# Patient Record
Sex: Female | Born: 1945 | Race: White | Hispanic: No | Marital: Married | Smoking: Never smoker
Health system: Southern US, Community
[De-identification: ages and names within clinical notes are randomized; demographics above are authoritative.]

## PROBLEM LIST (undated history)

## (undated) DIAGNOSIS — F419 Anxiety disorder, unspecified: Secondary | ICD-10-CM

## (undated) DIAGNOSIS — I071 Rheumatic tricuspid insufficiency: Secondary | ICD-10-CM

## (undated) DIAGNOSIS — I350 Nonrheumatic aortic (valve) stenosis: Secondary | ICD-10-CM

## (undated) DIAGNOSIS — I509 Heart failure, unspecified: Secondary | ICD-10-CM

## (undated) DIAGNOSIS — I340 Nonrheumatic mitral (valve) insufficiency: Secondary | ICD-10-CM

## (undated) DIAGNOSIS — E785 Hyperlipidemia, unspecified: Secondary | ICD-10-CM

## (undated) DIAGNOSIS — I10 Essential (primary) hypertension: Secondary | ICD-10-CM

## (undated) DIAGNOSIS — I4891 Unspecified atrial fibrillation: Secondary | ICD-10-CM

## (undated) DIAGNOSIS — E039 Hypothyroidism, unspecified: Secondary | ICD-10-CM

## (undated) DIAGNOSIS — I499 Cardiac arrhythmia, unspecified: Secondary | ICD-10-CM

## (undated) DIAGNOSIS — M1990 Unspecified osteoarthritis, unspecified site: Secondary | ICD-10-CM

## (undated) DIAGNOSIS — E559 Vitamin D deficiency, unspecified: Secondary | ICD-10-CM

## (undated) HISTORY — DX: Essential (primary) hypertension: I10

## (undated) HISTORY — PX: CARDIAC ELECTROPHYSIOLOGY STUDY AND ABLATION: SHX1294

## (undated) HISTORY — PX: BREAST BIOPSY: SHX20

---

## 2004-12-07 ENCOUNTER — Ambulatory Visit: Payer: Self-pay | Admitting: Internal Medicine

## 2005-11-07 HISTORY — PX: MECHANICAL AORTIC VALVE REPLACEMENT: SHX2013

## 2006-04-23 ENCOUNTER — Other Ambulatory Visit: Payer: Self-pay

## 2006-04-23 ENCOUNTER — Emergency Department: Payer: Self-pay | Admitting: Emergency Medicine

## 2006-07-07 ENCOUNTER — Ambulatory Visit: Payer: Self-pay | Admitting: Cardiovascular Disease

## 2006-07-21 DIAGNOSIS — Z952 Presence of prosthetic heart valve: Secondary | ICD-10-CM | POA: Insufficient documentation

## 2006-07-21 DIAGNOSIS — Z5181 Encounter for therapeutic drug level monitoring: Secondary | ICD-10-CM | POA: Insufficient documentation

## 2006-08-23 ENCOUNTER — Encounter: Payer: Self-pay | Admitting: Cardiovascular Disease

## 2006-09-07 ENCOUNTER — Encounter: Payer: Self-pay | Admitting: Cardiovascular Disease

## 2007-04-04 ENCOUNTER — Ambulatory Visit: Payer: Self-pay | Admitting: Internal Medicine

## 2008-02-18 ENCOUNTER — Inpatient Hospital Stay: Payer: Self-pay | Admitting: Cardiovascular Disease

## 2008-02-20 ENCOUNTER — Other Ambulatory Visit: Payer: Self-pay

## 2008-02-21 ENCOUNTER — Other Ambulatory Visit: Payer: Self-pay

## 2008-04-04 ENCOUNTER — Other Ambulatory Visit: Payer: Self-pay

## 2008-04-04 ENCOUNTER — Emergency Department: Payer: Self-pay | Admitting: Emergency Medicine

## 2008-08-22 ENCOUNTER — Ambulatory Visit: Payer: Self-pay | Admitting: Internal Medicine

## 2009-04-10 ENCOUNTER — Ambulatory Visit: Payer: Self-pay | Admitting: Cardiovascular Disease

## 2009-07-11 ENCOUNTER — Emergency Department: Payer: Self-pay | Admitting: Emergency Medicine

## 2009-10-07 ENCOUNTER — Ambulatory Visit: Payer: Self-pay | Admitting: Internal Medicine

## 2009-10-26 ENCOUNTER — Ambulatory Visit: Payer: Self-pay | Admitting: Gastroenterology

## 2010-03-30 IMAGING — CR DG CHEST 1V PORT
1 series · 1 of 1 positions shown · non-contrast
Comparison: none

REASON FOR EXAM: sob
COMMENTS:

[view not recorded]
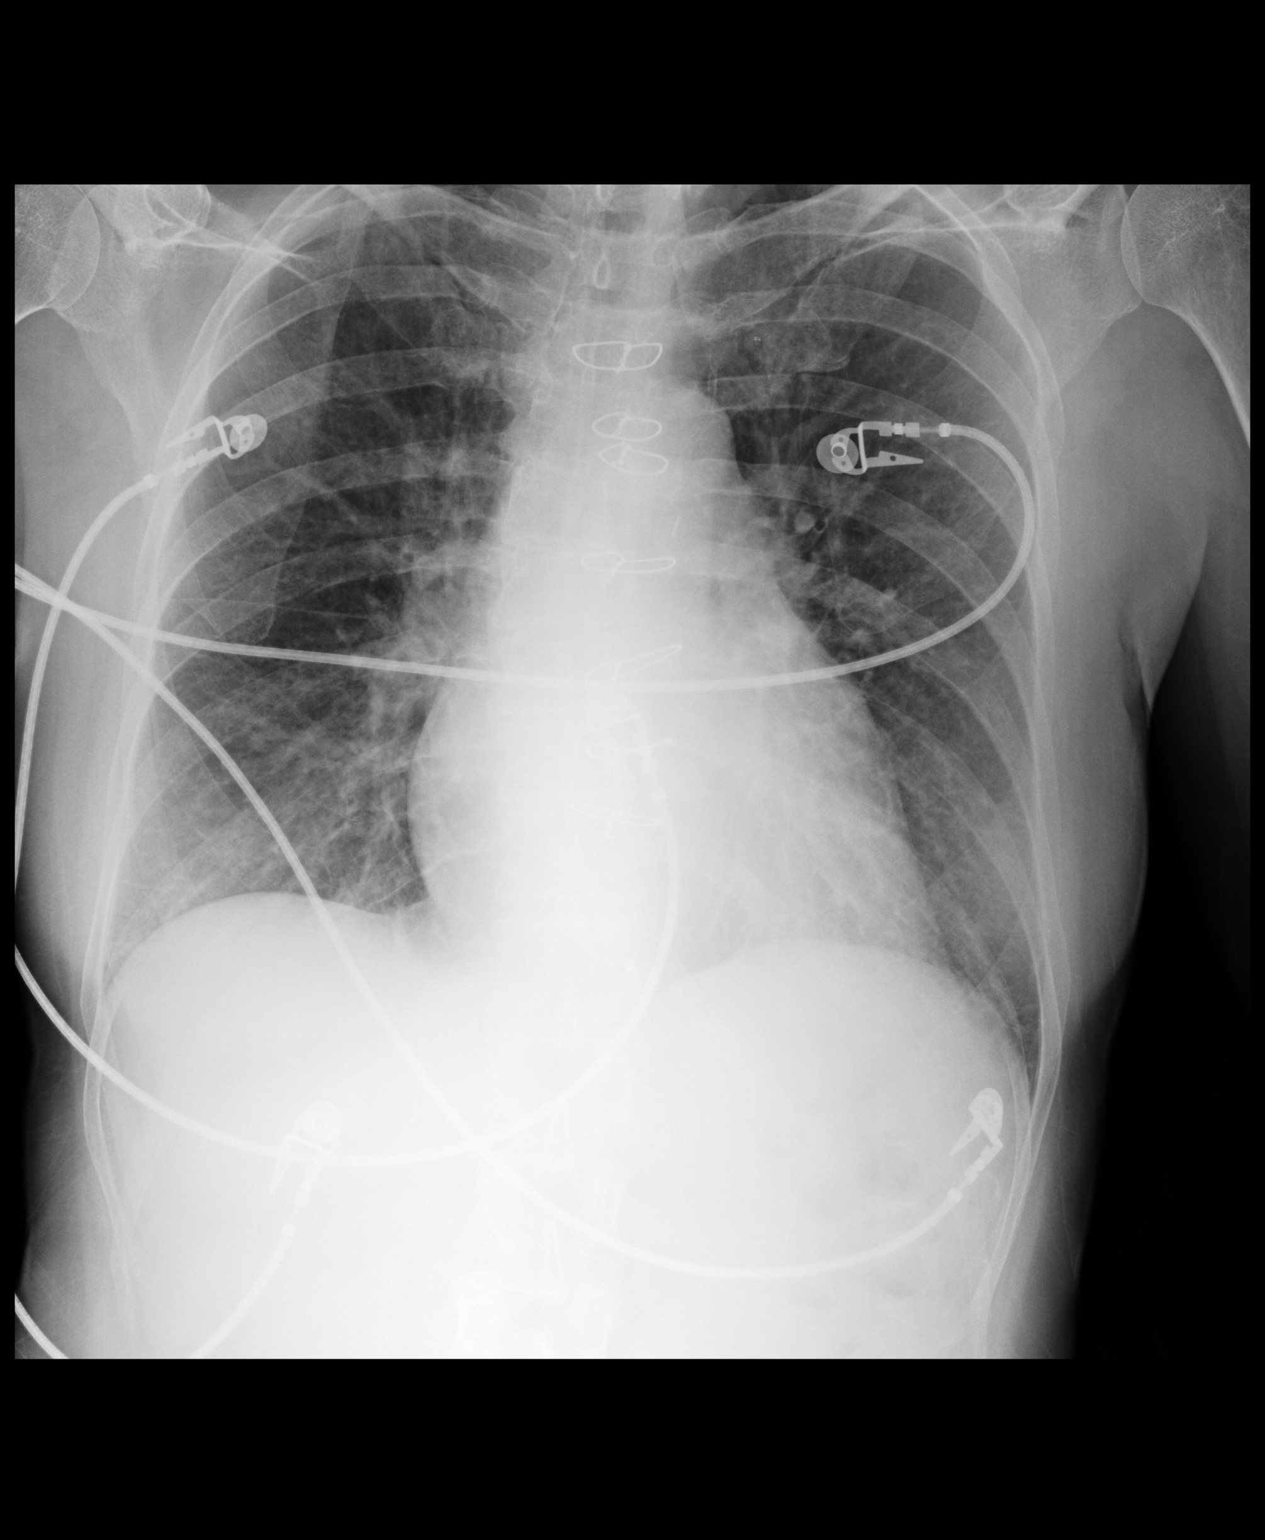

[1 of 1 positions shown; findings below may reference images not displayed]

PROCEDURE:     DXR - DXR PORTABLE CHEST SINGLE VIEW  - February 18, 2008 [DATE]

RESULT:     There is slight prominence of the pulmonary vascularity
compatible with pulmonary vascular congestion. No definite interstitial or
pulmonary edema is seen. No pleural effusion is noted. The heart is upper
limits to normal in size. Postoperative changes of prior CABG are observed.
IMPRESSION: 1. There is mild prominence of the pulmonary vascularity compatible with
mild pulmonary vascular congestion.
2. No pulmonary edema or pneumonia is seen.
3. The patient is status post CABG.

## 2010-10-13 ENCOUNTER — Ambulatory Visit: Payer: Self-pay | Admitting: Cardiovascular Disease

## 2011-02-02 ENCOUNTER — Ambulatory Visit: Payer: Self-pay | Admitting: Internal Medicine

## 2011-10-13 ENCOUNTER — Ambulatory Visit: Payer: Self-pay | Admitting: Cardiovascular Disease

## 2012-04-26 ENCOUNTER — Ambulatory Visit: Payer: Self-pay | Admitting: Internal Medicine

## 2012-04-29 DIAGNOSIS — I350 Nonrheumatic aortic (valve) stenosis: Secondary | ICD-10-CM | POA: Insufficient documentation

## 2012-05-08 DIAGNOSIS — IMO0001 Reserved for inherently not codable concepts without codable children: Secondary | ICD-10-CM | POA: Insufficient documentation

## 2012-05-08 DIAGNOSIS — H9190 Unspecified hearing loss, unspecified ear: Secondary | ICD-10-CM | POA: Insufficient documentation

## 2012-05-09 DIAGNOSIS — Z8679 Personal history of other diseases of the circulatory system: Secondary | ICD-10-CM | POA: Insufficient documentation

## 2012-08-29 ENCOUNTER — Ambulatory Visit: Payer: Self-pay | Admitting: Specialist

## 2012-11-22 ENCOUNTER — Ambulatory Visit: Payer: Self-pay | Admitting: Internal Medicine

## 2012-12-24 ENCOUNTER — Inpatient Hospital Stay: Payer: Self-pay | Admitting: Internal Medicine

## 2012-12-24 LAB — COMPREHENSIVE METABOLIC PANEL
Anion Gap: 9 (ref 7–16)
Co2: 22 mmol/L (ref 21–32)
EGFR (African American): >60
Potassium: 4.4 mmol/L (ref 3.5–5.1)
SGOT(AST): 50 U/L — ABNORMAL HIGH (ref 15–37)
SGPT (ALT): 41 U/L (ref 12–78)

## 2012-12-24 LAB — CBC
MCH: 28.8 pg (ref 26.0–34.0)
Platelet: 164 10*3/uL (ref 150–440)

## 2012-12-24 LAB — APTT: Activated PTT: 35.4 s

## 2012-12-24 LAB — T4, FREE: Free Thyroxine: 4.42 ng/dL — ABNORMAL HIGH

## 2012-12-24 LAB — TSH: Thyroid Stimulating Horm: <0.01 u[IU]/mL — ABNORMAL LOW

## 2012-12-24 LAB — PROTIME-INR
INR: 2.2
Prothrombin Time: 25 s — ABNORMAL HIGH

## 2012-12-24 LAB — TROPONIN I: Troponin-I: <0.02 ng/mL

## 2012-12-25 LAB — CBC WITH DIFFERENTIAL/PLATELET
Basophil #: 0 10*3/uL (ref 0.0–0.1)
Basophil %: 0.4 %
Eosinophil #: 0.1 10*3/uL (ref 0.0–0.7)
HGB: 10.5 g/dL — ABNORMAL LOW (ref 12.0–16.0)
Lymphocyte #: 1.6 10*3/uL (ref 1.0–3.6)
MCH: 28.7 pg (ref 26.0–34.0)
MCV: 85 fL (ref 80–100)
Monocyte #: 0.7 x10 3/mm (ref 0.2–0.9)
Platelet: 128 10*3/uL — ABNORMAL LOW (ref 150–440)
WBC: 4.1 10*3/uL (ref 3.6–11.0)

## 2012-12-25 LAB — BASIC METABOLIC PANEL
Anion Gap: 7 (ref 7–16)
BUN: 13 mg/dL (ref 7–18)
Calcium, Total: 8.5 mg/dL (ref 8.5–10.1)
Chloride: 114 mmol/L — ABNORMAL HIGH (ref 98–107)
EGFR (Non-African Amer.): >60
Glucose: 97 mg/dL (ref 65–99)
Osmolality: 287 (ref 275–301)
Potassium: 4.4 mmol/L (ref 3.5–5.1)
Sodium: 144 mmol/L (ref 136–145)

## 2012-12-25 LAB — MAGNESIUM: Magnesium: 1.7 mg/dL — ABNORMAL LOW

## 2012-12-26 LAB — BASIC METABOLIC PANEL
Anion Gap: 6 — ABNORMAL LOW (ref 7–16)
BUN: 14 mg/dL (ref 7–18)
Calcium, Total: 8.6 mg/dL (ref 8.5–10.1)
Co2: 24 mmol/L (ref 21–32)
Creatinine: 0.6 mg/dL (ref 0.60–1.30)
EGFR (African American): >60
EGFR (Non-African Amer.): >60
Osmolality: 286 (ref 275–301)
Potassium: 4.3 mmol/L (ref 3.5–5.1)

## 2012-12-26 LAB — T4, FREE: Free Thyroxine: 3.38 ng/dL — ABNORMAL HIGH (ref 0.76–1.46)

## 2012-12-26 LAB — PROTIME-INR: INR: 2.9

## 2012-12-26 LAB — MAGNESIUM: Magnesium: 1.9 mg/dL

## 2012-12-27 LAB — BASIC METABOLIC PANEL
Calcium, Total: 8.2 mg/dL — ABNORMAL LOW (ref 8.5–10.1)
Chloride: 111 mmol/L — ABNORMAL HIGH (ref 98–107)
Creatinine: 0.64 mg/dL (ref 0.60–1.30)
EGFR (African American): >60
EGFR (Non-African Amer.): >60
Osmolality: 286 (ref 275–301)

## 2012-12-27 LAB — HEPATIC FUNCTION PANEL A (ARMC)
Albumin: 2.8 g/dL — ABNORMAL LOW (ref 3.4–5.0)
Alkaline Phosphatase: 59 U/L (ref 50–136)
Bilirubin, Direct: 0.3 mg/dL — ABNORMAL HIGH (ref 0.00–0.20)
Total Protein: 5.6 g/dL — ABNORMAL LOW (ref 6.4–8.2)

## 2012-12-28 LAB — BASIC METABOLIC PANEL
Anion Gap: 9 (ref 7–16)
BUN: 12 mg/dL (ref 7–18)
Calcium, Total: 7.9 mg/dL — ABNORMAL LOW (ref 8.5–10.1)
Chloride: 107 mmol/L (ref 98–107)
Creatinine: 0.65 mg/dL (ref 0.60–1.30)
EGFR (Non-African Amer.): >60
Glucose: 101 mg/dL — ABNORMAL HIGH (ref 65–99)
Osmolality: 279 (ref 275–301)

## 2012-12-28 LAB — MAGNESIUM: Magnesium: 1.6 mg/dL — ABNORMAL LOW

## 2012-12-28 LAB — T4, FREE: Free Thyroxine: 3.36 ng/dL — ABNORMAL HIGH (ref 0.76–1.46)

## 2012-12-28 LAB — PROTIME-INR
INR: 3.8
Prothrombin Time: 37.4 secs — ABNORMAL HIGH (ref 11.5–14.7)

## 2013-02-07 DIAGNOSIS — E0590 Thyrotoxicosis, unspecified without thyrotoxic crisis or storm: Secondary | ICD-10-CM | POA: Insufficient documentation

## 2013-08-13 ENCOUNTER — Ambulatory Visit: Payer: Self-pay | Admitting: Internal Medicine

## 2013-11-07 HISTORY — PX: OTHER SURGICAL HISTORY: SHX169

## 2014-01-18 ENCOUNTER — Inpatient Hospital Stay: Payer: Self-pay | Admitting: Internal Medicine

## 2014-01-18 LAB — POTASSIUM
Potassium: 3.6 mmol/L (ref 3.5–5.1)
Potassium: 4 mmol/L (ref 3.5–5.1)

## 2014-01-18 LAB — COMPREHENSIVE METABOLIC PANEL
ALBUMIN: 3.5 g/dL (ref 3.4–5.0)
Alkaline Phosphatase: 106 U/L
Anion Gap: 9 (ref 7–16)
BILIRUBIN TOTAL: 1.6 mg/dL — AB (ref 0.2–1.0)
BUN: 6 mg/dL — ABNORMAL LOW (ref 7–18)
CO2: 22 mmol/L (ref 21–32)
Calcium, Total: 8.7 mg/dL (ref 8.5–10.1)
Chloride: 107 mmol/L (ref 98–107)
Creatinine: 1 mg/dL (ref 0.60–1.30)
GFR CALC NON AF AMER: 59 — AB
GLUCOSE: 167 mg/dL — AB (ref 65–99)
OSMOLALITY: 277 (ref 275–301)
POTASSIUM: 3.4 mmol/L — AB (ref 3.5–5.1)
SGOT(AST): 90 U/L — ABNORMAL HIGH (ref 15–37)
SGPT (ALT): 44 U/L (ref 12–78)
Sodium: 138 mmol/L (ref 136–145)
TOTAL PROTEIN: 6.5 g/dL (ref 6.4–8.2)

## 2014-01-18 LAB — URINALYSIS, COMPLETE
BACTERIA: NONE SEEN
Bilirubin,UR: NEGATIVE
Blood: NEGATIVE
Glucose,UR: 50 mg/dL (ref 0–75)
KETONE: NEGATIVE
Leukocyte Esterase: NEGATIVE
Nitrite: NEGATIVE
PH: 6 (ref 4.5–8.0)
Protein: 100
SPECIFIC GRAVITY: 1.013 (ref 1.003–1.030)
Squamous Epithelial: NONE SEEN
WBC UR: 2 /HPF (ref 0–5)

## 2014-01-18 LAB — CK-MB: CK-MB: 3.1 ng/mL (ref 0.5–3.6)

## 2014-01-18 LAB — PROTIME-INR
INR: 3.2
Prothrombin Time: 32 secs — ABNORMAL HIGH (ref 11.5–14.7)

## 2014-01-18 LAB — CBC
HCT: 33.3 % — AB (ref 35.0–47.0)
HGB: 11 g/dL — ABNORMAL LOW (ref 12.0–16.0)
MCH: 26.9 pg (ref 26.0–34.0)
MCHC: 33.1 g/dL (ref 32.0–36.0)
MCV: 81 fL (ref 80–100)
Platelet: 178 10*3/uL (ref 150–440)
RBC: 4.1 10*6/uL (ref 3.80–5.20)
RDW: 15.5 % — ABNORMAL HIGH (ref 11.5–14.5)
WBC: 7.1 10*3/uL (ref 3.6–11.0)

## 2014-01-18 LAB — TSH: Thyroid Stimulating Horm: 1.59 u[IU]/mL

## 2014-01-18 LAB — CK TOTAL AND CKMB (NOT AT ARMC)
CK, TOTAL: 117 U/L
CK, Total: 112 U/L
CK-MB: 2 ng/mL (ref 0.5–3.6)
CK-MB: 2.7 ng/mL (ref 0.5–3.6)

## 2014-01-18 LAB — APTT: ACTIVATED PTT: 36.2 s — AB (ref 23.6–35.9)

## 2014-01-18 LAB — TROPONIN I
TROPONIN-I: 0.21 ng/mL — AB
TROPONIN-I: 0.34 ng/mL — AB
TROPONIN-I: 0.29 ng/mL — AB

## 2014-01-18 LAB — MAGNESIUM
Magnesium: 1.7 mg/dL — ABNORMAL LOW
Magnesium: 3.7 mg/dL — ABNORMAL HIGH
Magnesium: 3.1 mg/dL — ABNORMAL HIGH

## 2014-01-18 LAB — PHOSPHORUS: PHOSPHORUS: 2.2 mg/dL — AB (ref 2.5–4.9)

## 2014-01-18 LAB — PRO B NATRIURETIC PEPTIDE: B-Type Natriuretic Peptide: 1358 pg/mL — ABNORMAL HIGH (ref 0–125)

## 2014-01-30 DIAGNOSIS — Z950 Presence of cardiac pacemaker: Secondary | ICD-10-CM | POA: Insufficient documentation

## 2014-09-30 ENCOUNTER — Ambulatory Visit: Payer: Self-pay | Admitting: Internal Medicine

## 2014-11-05 ENCOUNTER — Ambulatory Visit: Payer: Self-pay | Admitting: Internal Medicine

## 2015-01-03 IMAGING — NM NM THYROID IMAGING W/ UPTAKE SINGLE (24 HR)
1 series · 1 of 1 positions shown · non-contrast
Comparison: none

REASON FOR EXAM: hyperthyroidism
COMMENTS:

[Series 1000: (id) thyroid scan · 2.40mm/px · 1 of 1 slices shown]
[im 1/1  full-range]
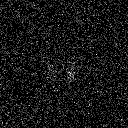

[1 of 1 positions shown; findings below may reference images not displayed]

PROCEDURE:     KNM - KNM THYROID Y-AYY 24HR [DATE]  [DATE]

RESULT:     Following administration of  145.6 microcuries of Y-AYY ,thyroid
scan and radioactive iodine uptake was determine. Radioactive iodine uptake
is extremely low at 6 and 24 hours. Radioactive iodine uptake is 0.8%. The
thyroid is barely visible on the scan. Hypothyroidism could present in this
fashion. Thyroiditis could inpresent this fashion. Medications can result in
low radioactive iodine uptake. Clinical correlation suggested suggested.
IMPRESSION: Extremely low radioactive radioactive iodine uptake as
described above.
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
## 2015-02-27 NOTE — Consult Note (Signed)
PATIENT NAME:  Kristin Ferrell, Kristin Ferrell MR#:  [id number] DATE OF BIRTH:  09/22/1946  DATE OF CONSULTATION:  12/25/2012  REQUESTING PROVIDER:  Enid Baasadhika Kalisetti, MD  DICTATING CONSULTANT:  Wendall MolaMelissa Solum, MD   CHIEF COMPLAINT:  Hyperthyroidism.   HISTORY OF PRESENT ILLNESS: This is a 69 year old female seen in consultation at the request of Dr. Nemiah CommanderKalisetti  today for hyperthyroidism. The patient was admitted yesterday with complaints of palpitations and presyncope.  At the office of Dr. Welton FlakesKhan, she had rapid atrial fibrillation with rapid ventricular response with a rate of 170. She has a history of hyperthyroidism and atrial fibrillation. Onset was about 4 years ago. She recalls that she was treated with medication for the hyperthyroidism for a period of time and was able to get off the medication when her thyroid function tests apparently normalized. In January of this year, she was again diagnosed with hyperthyroidism. She had been following up with Dr. Dario GuardianJadali. She underwent a thyroid uptake/scan on November 23, 2012 at [facility]. Both her 6 and 24-hour uptake were very low at 0.8%. At this time, her TSH is low at less than 0.010 and free T4 is elevated at 4.42. Heart rate is in the one 110s. She was taking amiodarone for atrial fibrillation up until about 6 months ago. She was exposed to contrast dye in November of this year when she underwent a CT angiogram. She denies any use of lithium or glucocorticoids. Since hospitalization yesterday, she has been receiving methimazole 10 mg t.i.d.    PAST MEDICAL HISTORY: 1.  Atrial fibrillation.  2.  Hyperthyroidism, 2011 and again January 2014.  3.  Hypertension.  4.  Hyperlipidemia.  5.  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  6.  Osteoporosis.   OUTPATIENT MEDICATIONS: 1.  Crestor 5 mg daily.  2.  Calcium 600 mg daily.  3.  Omega-3 fatty acids 1000 mg daily.  4.  Allegra 60 mg daily, as needed.  5.  Metoprolol ER 25 mg as needed for palpitations.  6.   Warfarin, as directed.  7.  Ramipril 2.5 mg daily.  8.  Tikosyn 250 mg q. 12 hours.   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 She does not smoke cigarettes.   FAMILY HISTORY:  No known thyroid disease.   ALLERGIES:  No known drug allergies.   PAST SURGICAL HISTORY: 1.  Mechanical aortic valve replacement, 2007.  2.  Left hip repair status post fracture. 3.  Left knee surgery.  REVIEW OF SYSTEMS: No weight loss. No fever.  HEENT:  No blurred vision. No sore throat.  NECK:  She has had choking at times on dry foods. She denies neck pain.  CARDIAC:  She has had palpitations as per HPI. She denies chest pain at this time.  PULMONARY:  She has dyspnea on exertion. She denies cough.  ABDOMEN:  Good appetite. No recent weight change.  EXTREMITIES:  Denies leg edema.  SKIN:  No rash or recent skin changes, denies dry skin.  ENDOCRINE:  Denies heat or cold intolerance.  GENITOURINARY:  Denies polyuria or dysuria. HEMATOLOGIC:  Denies easy bruisability or recent bleeding.   PHYSICAL EXAMINATION: VITAL SIGNS:  Height 65 inches, weight 140 pounds, BMI 23, temperature 97.5, pulse 111 - 140, respirations 18, BP 82/69, pulse oximetry 98% on 2 liters O2.  GENERAL:  Well-developed white female in no acute distress.  HEENT:  EOMI.  No proptosis. No lid lag or stare. Oropharynx clear.  Mucous membranes moist.  NECK:  Supple. No neck tenderness to palpation. No thyromegaly. No palpable thyroid nodules.  CARDIAC:  Irregular, irregular and tachycardic. No appreciable carotid bruit.  PULMONARY:  Clear bilaterally. No wheeze.  ABDOMEN:  Diffusely soft, nontender. Positive bowel sounds.  EXTREMITIES:  No edema is present.  SKIN:  No rash or dermatopathy is present.  No acanthosis nigricans.  NEUROLOGIC:  No tremor of outstretched hands.   ASSESSMENT: A 69 year old female with hyperthyroidism rapid atrial fibrillation. Onset of hyperthyroidism was at least in  January of this year. Thyroid uptake/scan on January 17 showed low uptake diffusely, which is commonly seen in hypothyroidism or acute thyroiditis. Her clinical picture does not fit with either of these causes, rather it fits most with recurrence of autoimmune thyroid disease or Graves' disease.   RECOMMENDATIONS: 1.  I recommend continuing methimazole. Will adjust dose to 30 mg b.i.d.  2. Will obtain thyroid autoantibodies to include thyrotropin receptor antibody and thyroid stimulating immunoglobulin. These labs tend to be elevated in autoimmune thyroid disease.  3.  Continue beta blocker, as tolerated to control tachycardia if her blood pressure will tolerate.  4. Will repeat free T4 in 2 to 3 days. TSH likely will remain suppressed for many weeks, however, free T4 should improve with methimazole if this is autoimmune thyroid disease.   Thank you for the kind request for consultation. I will follow along with you.      ____________________________ A. Wendall Mola, MD ams:ce D: 12/25/2012 16:51:34 ET T: 12/25/2012 17:11:44 ET JOB#: [id number]  cc: A. Wendall Mola, MD, <Dictator> Macy Mis MD ELECTRONICALLY SIGNED 12/30/2012 19:45

## 2015-02-27 NOTE — Discharge Summary (Signed)
  DATE OF BIRTH:  1946-05-22  DATE OF ADMISSION:  12/24/2012  DATE OF DISCHARGE:  12/28/2012  DISCHARGE DIAGNOSES: 1.  Atrial fibrillation.  2.   Silent thyroiditis.   SECONDARY DIAGNOSES: 1.  Valvular heart disease, status post aortic valve replacement.  2.  Hyperlipidemia.  3.  Hypertension.   DISCHARGE MEDICATIONS:   1.  Fish oil 1000 mg daily. 2.  Crestor 5 mg at bedtime. 3.  Calcium and vitamin D. 4.  Ramipril 2.5 mg daily. 5.  Coumadin 6 mg Monday, Wednesday, Friday; 5 mg Tuesday, Thursday, Saturday, Sunday. 6.  Tikosyn 250 mcg b.i.d.  7.  Metamucil.  8.  Prednisone 50 mg once a day.  9.  Propranolol 20 mg 4 times a day.  10.  Magnesium oxide 400 mg t.i.d.  Medications discontinued:  Metoprolol.   HOSPITAL COURSE: This lady was admitted from her cardiologist's office with a rapid ventricular response and longstanding atrial fibrillation, and biochemical evidence of hyperthyroidism. An Endocrinology consultation was placed with Dr. Tedd SiasSolum, who investigated the patient for possible recurrence of her Graves' disease. However, her biochemical workup suggested silent thyroiditis, and patient was started on oral prednisone. The patient's heart rate was initially elevated. Initially, control of that was complicated by her hypotension. However, we were able to gradually titrate her propranolol to an effective dose, and once patient started her prednisone, she responded to that quite well symptomatically, with improvement in her heart rate. Dr. Tedd SiasSolum recommended continuing the prednisone until at least her followup appointment in early March. The patient's stay was otherwise uncomplicated, and is being discharged to home in a satisfactory condition.   DISCHARGE INSTRUCTIONS:  Diet:  Low sodium, low fat.  Activity as tolerated. Follow up in 1 to 2 weeks with Dr. Adrian BlackwaterShaukat Khan and Dr. Tedd SiasSolum.     ____________________________ Silas FloodSheikh A. Ellsworth Lennoxejan-Sie, MD sat:mr D: 01/12/2013 15:24:00  ET T: 01/12/2013 22:02:56 ET JOB#: [id number]  cc: A. Wendall MolaMelissa Solum, MD   Marland McalpineSheikh A. Ellsworth Lennoxejan-Sie, MD, <Dictator>   Charlesetta GaribaldiSHEIKH A TEJAN-SIE MD ELECTRONICALLY SIGNED 01/22/2013 12:20

## 2015-02-27 NOTE — Consult Note (Signed)
Chief Complaint and History:  Referring Physician Dr. Nemiah CommanderKalisetti   Chief Complaint Hyperthyroidism   Allergies:  No Known Allergies:   Assessment/Plan:  Assessment/Plan 69 yo F presented yesterday with palpitations and presyncope and found to have a.fib  with RVR and hyperthyroidism. She had hyperthyroidism 4 yrs ago also with episode of a.fib. That was treated medically and resolved. She did well up until 11/2012 when she had lab work again consistent with hyperthyroidism. She was seen by Dr. Dario GuardianJadali and he obtained a I-123 thyroid uptake/scan on 11/23/12 notable for low 6-hour and 24-hour uptake of 0.8%. No treatment was advised for hyperthyroidism and she was to see him in follow-up later this month. Of note, she had been taking amiodarone up until 06/2012. Sometime in 11/2012 she had a CT angiogram of the chest. No exposure to lithium. No other known exposure to iodinated contrast dye in recent months. She denies neck pain or fever. She denies tremor or heat intolerance. Labs yesterday showed TSH of <0.010, free T4 elevated at 4.42. HR has been in the 110s today.  A/ Hyperthyroidism - likely due to Grave's Disease. Would expect high homogenous uptake on her recent uptake/scan. Her low uptake makes me question if she had the CTA with iodinated contrast dye prior to the study which then interfered with subsequent iodine uptake.  P/ 1. Agree with use of methimazole. Will adjust dose to 30 mg bid. 2. Continue beta blcokers and consider titrating to pulse <90, if BP tolerates. 3. Will obtain thyroid autoantibodies to confirm if this is Grave's: TrAb and TSI were ordered. 4. Will track down the date of the CTA. Patient will hopefully get that report faxed to me today.  I will follow along with you. A full consult will be dictated.   Case Discussed With patient   Electronic Signatures: Raj JanusSolum, Anna M (MD)  (Signed 18-Feb-14 09:15)  Authored: Chief Complaint and History, ALLERGIES,  Assessment/Plan   Last Updated: 18-Feb-14 09:15 by Raj JanusSolum, Anna M (MD)
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
## 2015-02-28 NOTE — Discharge Summary (Signed)
PATIENT NAME:  Kristin Ferrell, Kristin Ferrell MR#:  [id number] DATE OF BIRTH:  June 15, 1946  DATE OF ADMISSION:  01/18/2014 DATE OF TRANSFER:  01/18/2014   ADMITTING PHYSICIAN: Enid Baasadhika Jerrica Thorman, MD  TRANSFERRING PHYSICIAN: Enid Baasadhika Kimyah Frein, MD  TRANSFER ACCEPTING FACILITY: [facility].   PRIMARY CARE PHYSICIAN: Margaretann LovelessNeelam S. Khan, MD   PRIMARY CARDIOLOGIST: Laurier NancyShaukat A. Khan, MD, at [facility]; and also [place]. [place] [facility] [place]  CONSULTATIONS IN THE HOSPITAL: Laurier NancyShaukat A. Khan, MD   TRANSFER FINAL DIAGNOSES: 1.  Ventricular tachycardia/torsades, history of paroxysmal atrial fibrillation and sick sinus syndrome.  2.  History of autoimmune thyroiditis, status post treatment.  3.  Aortic valve replacement surgery, status post mechanical aortic valve placement.  4.  Hyperlipidemia.  5.  Osteoporosis.   CURRENT MEDICATIONS: 1.  Amiodarone drip.  2.  Tylenol 650 mg q.4 hours p.r.n.  3.  Reglan 5 mg IV push q.6 hours p.r.n. for nausea, vomiting.  4.  Morphine 2 mg IV push q.4 hours p.r.n. for pain.  5.  Crestor 5 mg daily.  6.  Warfarin 7 mg p.o. daily.   LABORATORY AND IMAGING STUDIES: While here, her latest potassium done today was 4.0. Magnesium is 3.1. CK-MB 3.1 and last troponin 0.34.   Sodium 138, potassium 3.4, chloride 107, bicarbonate 22, BUN 6, creatinine 1.0, glucose 167, and calcium of 8.7.   ALT 44, AST 90, alkaline phosphatase 106, total bilirubin 1.6, and albumin of 3.5.  WBC 7.1, hemoglobin 11.3, hematocrit 33.3, platelet count 178. Urinalysis negative for any infection. TSH is 1.59.   BRIEF HOSPITAL COURSE: Kristin Ferrell is a 69 year old Caucasian female with past medical history significant for A. fib, sick sinus syndrome, who was in the process of getting her pacemaker done next week, was brought in secondary to torsades requiring cardioversion here.  1.  Cardiac arrhythmia- ventricular  tachycardia, looks more like torsades. Her electrolytes were replaced and have been  normalized. The patient has been on Tikosyn for her A. fib, which was stopped when she came in here. Per cardiology recommendation, she was placed on procainamide drip and that was stopped because she had 3 more episodes of prolonged QT leading her to go into torsades here. She is currently placed on amiodarone drip after cardioverting 3 times while in CCU. En route to the hospital, she was given a 150 mg bolus of amiodarone and was shock cardioverted once in the ED. Currently the patient is awake, alert, stable. Her vitals are otherwise stable. Though she feels weak and dizzy, she gets symptomatic before the episode of torsades. No previous history for the same. The patient will need a defibrillator along with the pacemaker. Since [facility], she will be transferred over there for further work-up and treatment.  2.  Aortic valve replacement, status post mechanical aortic valve. She is on Coumadin. INR is 3.2, which is therapeutic. We will continue that. 3.  Hyperlipemia. She is on Crestor.   She has an EKG pending at this time with QTc. Her initial QTc was 667.  ADDITIONAL TIME SPENT: 40 minutes.   ____________________________ Enid Baasadhika Jamone Garrido, MD rk:jcm D: 01/18/2014 17:58:23 ET T: 01/18/2014 19:00:41 ET JOB#: [id number]  cc: Enid Baasadhika Adrena Nakamura, MD, <Dictator> Laurier NancyShaukat A. Khan, MD Margaretann LovelessNeelam S. Khan, MD Enid BaasADHIKA Auri Jahnke MD ELECTRONICALLY SIGNED 01/30/2014 15:56

## 2015-02-28 NOTE — Consult Note (Signed)
PATIENT NAME:  Kristin Ferrell, Kristin Ferrell MR#:  [id number] DATE OF BIRTH:  Aug 13, 1946  CARDIOLOGY CONSULTATION   DATE OF CONSULTATION:  01/18/2014  CONSULTING PHYSICIAN:  Laurier Nancy, MD  HISTORY OF PRESENT ILLNESS: This is a 69 year old white female with a past medical history of aortic valve replacement due to severe aortic regurgitation. She had St. Jude aortic valve replacement at [facility] several years back. She developed mild mitral regurgitation at that time too, but it was not treated. Since then, she developed moderate to severe mitral regurgitation with dilated left atrium and normal left ventricular systolic function and started developing atrial fibrillation. She was treated initially with amiodarone after cardioversion and was doing fine for several years, and then she had a rash from amiodarone and was sent to [facility], where she was started on Tikosyn, and she converted to sinus rhythm after being on Tikosyn. In the meantime, she would go back into atrial fibrillation and have brady episodes in between when she was in sinus rhythm. She would have brady episodes up to 30s and tachy episodes up to 160, and she was planned to have a permanent pacemaker implanted next Friday by Dr. Maisie Fus at [facility]. Today, according to her daughter, she all of a sudden became very short of breath, diaphoretic and fainted. In the EMS, she was found to have ventricular tachycardia or torsade de pointes, and they were ready to cardiovert her; however, with a bump from ambulance, she converted to sinus rhythm. In the Emergency Room, she was given magnesium 2 grams, but while she was getting magnesium, she went into torsade de pointes and was shocked again because she became unresponsive. She had EKG which showed sinus rhythm with QT prolongation of 660. Followup EKG: QT went down to 550 after 2 grams of magnesium. Third EKG: Her QT prolongation went up again to 610, so she was  given another 2 grams of magnesium, and a magnesium drip is being started. She was also given 150 bolus.  PAST MEDICAL HISTORY:  1. History of hypertension.  2. History of aortic valve replacement with St. Jude.  3. History of atrial fibrillation.  4. History of hypothyroidism, treated with antithyroid drugs.   SOCIAL HISTORY: She denies EtOH abuse or smoking.   FAMILY HISTORY: Positive for coronary artery disease.   PHYSICAL EXAMINATION:  GENERAL: She is alert and oriented, in mild distress and diaphoretic.  VITAL SIGNS: Her pulse is 66, blood pressure 110/70, respirations 18. She is afebrile.  NECK: Revealed no JVD.  LUNGS: Clear.  HEART: Regular rate and rhythm. Normal S1, S2. A 2/6 systolic murmur at the mitral area.  ABDOMEN: Soft, nontender, positive bowel sounds.  EXTREMITIES: No pedal edema.   DIAGNOSTIC DATA: EKG, as mentioned, the patient had torsade de pointes, and EKG the current one shows sinus rhythm, 62 beats per minute, QTc of 623. Magnesium is 1.7. Troponin is 0.21. Creatinine is 1.0, sodium is 138, potassium 3.4. BNP is 1358.   ASSESSMENT AND PLAN: The patient has mild hypomagnesemia, mild hypokalemia, prolonged QT interval, probably due to Tikosyn that she has been taking. She has been taking 2 tablets of 125 mcg b.i.d. Prolonged QT led to torsade de pointes. Advised continuation of magnesium by IV drip and also, even though she got 150 bolus of amiodarone and lidocain Also, will consider having Dr. Maisie Fus change to defibrillator instead of putting a permanent pacemaker in. Will discuss with Dr. Maisie Fus on Monday.   Thank you  very much for the referral.   ____________________________ Laurier NancyShaukat A. Sahvannah Rieser, MD sak:lb D: 01/18/2014 10:04:08 ET T: 01/18/2014 11:33:05 ET JOB#: [id number]  cc: Laurier NancyShaukat A. Carter Kaman, MD, <Dictator> Laurier NancySHAUKAT A Aaryav Hopfensperger MD ELECTRONICALLY SIGNED 01/20/2014 12:17

## 2015-09-02 ENCOUNTER — Other Ambulatory Visit: Payer: Self-pay | Admitting: Nurse Practitioner

## 2015-09-02 DIAGNOSIS — Z1231 Encounter for screening mammogram for malignant neoplasm of breast: Secondary | ICD-10-CM

## 2015-11-03 ENCOUNTER — Ambulatory Visit: Payer: Self-pay

## 2015-11-11 ENCOUNTER — Ambulatory Visit
Admission: RE | Admit: 2015-11-11 | Discharge: 2015-11-11 | Disposition: A | Discharge status: Home / Self Care | Payer: BLUE CROSS/BLUE SHIELD | Source: Ambulatory Visit | Attending: Nurse Practitioner | Admitting: Nurse Practitioner

## 2015-11-11 DIAGNOSIS — Z1231 Encounter for screening mammogram for malignant neoplasm of breast: Secondary | ICD-10-CM | POA: Insufficient documentation

## 2016-10-20 ENCOUNTER — Other Ambulatory Visit: Payer: Self-pay | Admitting: Nurse Practitioner

## 2016-10-20 DIAGNOSIS — Z1231 Encounter for screening mammogram for malignant neoplasm of breast: Secondary | ICD-10-CM

## 2016-12-05 ENCOUNTER — Ambulatory Visit
Admission: RE | Admit: 2016-12-05 | Discharge: 2016-12-05 | Disposition: A | Discharge status: Home / Self Care | Payer: BLUE CROSS/BLUE SHIELD | Source: Ambulatory Visit | Attending: Nurse Practitioner | Admitting: Nurse Practitioner

## 2016-12-05 DIAGNOSIS — Z1231 Encounter for screening mammogram for malignant neoplasm of breast: Secondary | ICD-10-CM | POA: Diagnosis present

## 2017-09-13 ENCOUNTER — Ambulatory Visit: Payer: Self-pay | Admitting: Obstetrics and Gynecology

## 2017-10-18 ENCOUNTER — Ambulatory Visit (INDEPENDENT_AMBULATORY_CARE_PROVIDER_SITE_OTHER): Payer: 59 | Admitting: Obstetrics and Gynecology

## 2017-10-18 ENCOUNTER — Encounter: Payer: Self-pay | Admitting: Obstetrics and Gynecology

## 2017-10-18 DIAGNOSIS — Z13820 Encounter for screening for osteoporosis: Secondary | ICD-10-CM | POA: Diagnosis not present

## 2017-10-18 DIAGNOSIS — Z01419 Encounter for gynecological examination (general) (routine) without abnormal findings: Secondary | ICD-10-CM | POA: Diagnosis not present

## 2017-10-18 DIAGNOSIS — Z1231 Encounter for screening mammogram for malignant neoplasm of breast: Secondary | ICD-10-CM

## 2017-10-18 DIAGNOSIS — Z1239 Encounter for other screening for malignant neoplasm of breast: Secondary | ICD-10-CM

## 2017-10-18 NOTE — Patient Instructions (Signed)
Preventive Care 65 Years and Older, Female Preventive care refers to lifestyle choices and visits with your health care provider that can promote health and wellness. What does preventive care include?  A yearly physical exam. This is also called an annual well check.  Dental exams once or twice a year.  Routine eye exams. Ask your health care provider how often you should have your eyes checked.  Personal lifestyle choices, including: ? Daily care of your teeth and gums. ? Regular physical activity. ? Eating a healthy diet. ? Avoiding tobacco and drug use. ? Limiting alcohol use. ? Practicing safe sex. ? Taking low-dose aspirin every day. ? Taking vitamin and mineral supplements as recommended by your health care provider. What happens during an annual well check? The services and screenings done by your health care provider during your annual well check will depend on your age, overall health, lifestyle risk factors, and family history of disease. Counseling Your health care provider may ask you questions about your:  Alcohol use.  Tobacco use.  Drug use.  Emotional well-being.  Home and relationship well-being.  Sexual activity.  Eating habits.  History of falls.  Memory and ability to understand (cognition).  Work and work environment.  Reproductive health.  Screening You may have the following tests or measurements:  Height, weight, and BMI.  Blood pressure.  Lipid and cholesterol levels. These may be checked every 5 years, or more frequently if you are over 50 years old.  Skin check.  Lung cancer screening. You may have this screening every year starting at age 55 if you have a 30-pack-year history of smoking and currently smoke or have quit within the past 15 years.  Fecal occult blood test (FOBT) of the stool. You may have this test every year starting at age 50.  Flexible sigmoidoscopy or colonoscopy. You may have a sigmoidoscopy every 5 years or  a colonoscopy every 10 years starting at age 50.  Hepatitis C blood test.  Hepatitis B blood test.  Sexually transmitted disease (STD) testing.  Diabetes screening. This is done by checking your blood sugar (glucose) after you have not eaten for a while (fasting). You may have this done every 1-3 years.  Bone density scan. This is done to screen for osteoporosis. You may have this done starting at age 65.  Mammogram. This may be done every 1-2 years. Talk to your health care provider about how often you should have regular mammograms.  Talk with your health care provider about your test results, treatment options, and if necessary, the need for more tests. Vaccines Your health care provider may recommend certain vaccines, such as:  Influenza vaccine. This is recommended every year.  Tetanus, diphtheria, and acellular pertussis (Tdap, Td) vaccine. You may need a Td booster every 10 years.  Varicella vaccine. You may need this if you have not been vaccinated.  Zoster vaccine. You may need this after age 60.  Measles, mumps, and rubella (MMR) vaccine. You may need at least one dose of MMR if you were born in 1957 or later. You may also need a second dose.  Pneumococcal 13-valent conjugate (PCV13) vaccine. One dose is recommended after age 65.  Pneumococcal polysaccharide (PPSV23) vaccine. One dose is recommended after age 65.  Meningococcal vaccine. You may need this if you have certain conditions.  Hepatitis A vaccine. You may need this if you have certain conditions or if you travel or work in places where you may be exposed to hepatitis   A.  Hepatitis B vaccine. You may need this if you have certain conditions or if you travel or work in places where you may be exposed to hepatitis B.  Haemophilus influenzae type b (Hib) vaccine. You may need this if you have certain conditions.  Talk to your health care provider about which screenings and vaccines you need and how often you  need them. This information is not intended to replace advice given to you by your health care provider. Make sure you discuss any questions you have with your health care provider. Document Released: 11/20/2015 Document Revised: 07/13/2016 Document Reviewed: 08/25/2015 Elsevier Interactive Patient Education  2017 [employer].

## 2017-10-18 NOTE — Progress Notes (Signed)
Patient ID: Kristin Ferrell, female   DOB: 1945/12/25, 71 y.o.   MRN: [id number]     Gynecology Annual Exam  PCP: Patient, No Pcp Per  Chief Complaint:  Chief Complaint  Patient presents with  . Gynecologic Exam    History of Present Illness:Patient is a 71 y.o. G2P2 presents for annual exam. The patient has no complaints today.   LMP: No LMP recorded. Patient is postmenopausal. No bleeding concerns  The patient does perform self breast exams.  There is no notable family history of breast or ovarian cancer in her family.  The patient wears seatbelts: yes.   The patient has regular exercise: not asked.    The patient denies current symptoms of depression.     Review of Systems: Review of Systems  Constitutional: Negative for chills and fever.  HENT: Negative for congestion.   Respiratory: Negative for cough and shortness of breath.   Cardiovascular: Positive for palpitations. Negative for chest pain.  Gastrointestinal: Negative for abdominal pain, constipation, diarrhea, heartburn, nausea and vomiting.  Genitourinary: Negative for dysuria, frequency and urgency.  Skin: Negative for itching and rash.  Neurological: Negative for dizziness and headaches.  Endo/Heme/Allergies: Negative for polydipsia.  Psychiatric/Behavioral: Negative for depression.    Past Medical History:  Past Medical History:  Diagnosis Date  . Hypertension     Past Surgical History:  Past Surgical History:  Procedure Laterality Date  . BREAST BIOPSY  22 +   stereo bx   . MECHANICAL AORTIC VALVE REPLACEMENT  2007  . pace maker  [phone]    Gynecologic History:  No LMP recorded. Patient is postmenopausal. Last Pap: Results were:09/30/2014 NIL and HR HPV negative  Last mammogram: 12/05/2016 Results were: BI-RAD I Obstetric History: G2P2  Family History:  Family History  Problem Relation Age of Onset  . Breast cancer Neg Hx     Social History:  Social History   Socioeconomic History  .  Marital status: Married    Spouse name: Not on file  . Number of children: Not on file  . Years of education: Not on file  . Highest education level: Not on file  Social Needs  . Financial resource strain: Not on file  . Food insecurity - worry: Not on file  . Food insecurity - inability: Not on file  . Transportation needs - medical: Not on file  . Transportation needs - non-medical: Not on file  Occupational History  . Not on file  Tobacco Use  . Smoking status: Never Smoker  . Smokeless tobacco: Never Used  Substance and Sexual Activity  . Alcohol use: No    Frequency: Never  . Drug use: No  . Sexual activity: No    Partners: Male    Birth control/protection: None  Other Topics Concern  . Not on file  Social History Narrative  . Not on file    Allergies:  Allergies  Allergen Reactions  . Hydralazine Other (See Comments) and Shortness Of Breath    hyptoension    Medications: Prior to Admission medications   Medication Sig Start Date End Date Taking? Authorizing Provider  Cholecalciferol (VITAMIN D3) 2000 units capsule Take by mouth.   Yes [provider]  furosemide (LASIX) 20 MG tablet Lasix 20 mg tablet  Take 1 tablet every day by oral route.   Yes [provider]  linaclotide (LINZESS) 72 MCG capsule Take by mouth.   Yes [provider]  lisinopril (PRINIVIL,ZESTRIL) 10 MG tablet Take by mouth.  12/23/16  Yes [provider]  metoprolol succinate (TOPROL-XL) 50 MG 24 hr tablet metoprolol succinate ER 50 mg tablet,extended release 24 hr 12/29/16  Yes [provider]  Pitavastatin Calcium (LIVALO) 2 MG TABS Livalo 2 mg tablet   Yes [provider]  warfarin (COUMADIN) 6 MG tablet warfarin 6 mg tablet 07/03/13  Yes [provider]    Physical Exam Vitals: Blood pressure 100/66, pulse (!) 110, height 5' 5.5" (1.664 m), weight 152 lb (68.9 kg).  General: NAD HEENT: normocephalic, anicteric Thyroid: no  enlargement, no palpable nodules Pulmonary: No increased work of breathing, CTAB Cardiovascular: RRR, distal pulses 2+ Breast: Breast symmetrical, no tenderness, no palpable nodules or masses, no skin or nipple retraction present, no nipple discharge.  No axillary or supraclavicular lymphadenopathy, old sternotomy scar Abdomen: NABS, soft, non-tender, non-distended.  Umbilicus without lesions.  No hepatomegaly, splenomegaly or masses palpable. No evidence of hernia  Genitourinary:  External: Normal external female genitalia.  Normal urethral meatus, normal  Bartholin's and Skene's glands.    Vagina: Normal vaginal mucosa, mild cystocele  Cervix: Grossly normal in appearance, no bleeding  Uterus: Non-enlarged, mobile, normal contour.  No CMT  Adnexa: ovaries non-enlarged, no adnexal masses  Rectal: deferred  Lymphatic: no evidence of inguinal lymphadenopathy Extremities: no edema, erythema, or tenderness Neurologic: Grossly intact Psychiatric: mood appropriate, affect full  Female chaperone present for pelvic and breast  portions of the physical exam     Assessment: 71 y.o. G2P2 routine annual exam  Plan: Problem List Items Addressed This Visit    None    Visit Diagnoses    Encounter for screening for osteoporosis       Breast screening       Relevant Orders   MM DIGITAL SCREENING BILATERAL   Encounter for gynecological examination without abnormal finding          1) Mammogram - recommend yearly screening mammogram.  Mammogram Is up to date  2) STI screening was not offered  3) ASCCP guidelines and rational discussed.  Patient opts for discontinue screening interval  4) Osteoporosis  - per USPTF routine screening DEXA at age 89 - has been previously obtained through PCP  5) Routine healthcare maintenance including cholesterol, diabetes screening discussed managed by PCP  6) Colonoscopy pending cardiac clearance with Dr. Mechele CollinElliott, had positive cologuard this year  7)  Received influenza vaccination at PCP  8) Follow up 1 year for routine annual

## 2017-10-23 ENCOUNTER — Other Ambulatory Visit: Payer: Self-pay | Admitting: Nurse Practitioner

## 2017-10-23 DIAGNOSIS — Z1231 Encounter for screening mammogram for malignant neoplasm of breast: Secondary | ICD-10-CM

## 2017-12-06 ENCOUNTER — Ambulatory Visit
Admission: RE | Admit: 2017-12-06 | Discharge: 2017-12-06 | Disposition: A | Discharge status: Home / Self Care | Payer: Managed Care, Other (non HMO) | Source: Ambulatory Visit | Attending: Nurse Practitioner | Admitting: Nurse Practitioner

## 2017-12-06 DIAGNOSIS — Z1231 Encounter for screening mammogram for malignant neoplasm of breast: Secondary | ICD-10-CM | POA: Diagnosis not present

## 2018-03-05 ENCOUNTER — Ambulatory Visit
Admission: RE | Admit: 2018-03-05 | Discharge: 2018-03-05 | Disposition: A | Discharge status: Home / Self Care | Payer: Managed Care, Other (non HMO) | Source: Ambulatory Visit | Attending: Unknown Physician Specialty | Admitting: Unknown Physician Specialty

## 2018-03-05 ENCOUNTER — Ambulatory Visit: Payer: Managed Care, Other (non HMO) | Admitting: Anesthesiology

## 2018-03-05 ENCOUNTER — Encounter
Admission: RE | Disposition: A | Discharge status: Home / Self Care | Payer: Self-pay | Source: Ambulatory Visit | Attending: Unknown Physician Specialty

## 2018-03-05 ENCOUNTER — Encounter: Payer: Self-pay | Admitting: Anesthesiology

## 2018-03-05 DIAGNOSIS — K635 Polyp of colon: Secondary | ICD-10-CM | POA: Diagnosis not present

## 2018-03-05 DIAGNOSIS — E039 Hypothyroidism, unspecified: Secondary | ICD-10-CM | POA: Insufficient documentation

## 2018-03-05 DIAGNOSIS — Z86010 Personal history of colonic polyps: Secondary | ICD-10-CM | POA: Insufficient documentation

## 2018-03-05 DIAGNOSIS — Z1211 Encounter for screening for malignant neoplasm of colon: Secondary | ICD-10-CM | POA: Diagnosis not present

## 2018-03-05 DIAGNOSIS — I110 Hypertensive heart disease with heart failure: Secondary | ICD-10-CM | POA: Diagnosis not present

## 2018-03-05 DIAGNOSIS — Z79899 Other long term (current) drug therapy: Secondary | ICD-10-CM | POA: Diagnosis not present

## 2018-03-05 DIAGNOSIS — Z952 Presence of prosthetic heart valve: Secondary | ICD-10-CM | POA: Insufficient documentation

## 2018-03-05 DIAGNOSIS — M1990 Unspecified osteoarthritis, unspecified site: Secondary | ICD-10-CM | POA: Diagnosis not present

## 2018-03-05 DIAGNOSIS — K640 First degree hemorrhoids: Secondary | ICD-10-CM | POA: Diagnosis not present

## 2018-03-05 DIAGNOSIS — K5730 Diverticulosis of large intestine without perforation or abscess without bleeding: Secondary | ICD-10-CM | POA: Insufficient documentation

## 2018-03-05 DIAGNOSIS — F419 Anxiety disorder, unspecified: Secondary | ICD-10-CM | POA: Diagnosis not present

## 2018-03-05 DIAGNOSIS — I509 Heart failure, unspecified: Secondary | ICD-10-CM | POA: Diagnosis not present

## 2018-03-05 DIAGNOSIS — Z888 Allergy status to other drugs, medicaments and biological substances status: Secondary | ICD-10-CM | POA: Insufficient documentation

## 2018-03-05 DIAGNOSIS — I340 Nonrheumatic mitral (valve) insufficiency: Secondary | ICD-10-CM | POA: Insufficient documentation

## 2018-03-05 DIAGNOSIS — E785 Hyperlipidemia, unspecified: Secondary | ICD-10-CM | POA: Diagnosis not present

## 2018-03-05 DIAGNOSIS — Z7901 Long term (current) use of anticoagulants: Secondary | ICD-10-CM | POA: Diagnosis not present

## 2018-03-05 DIAGNOSIS — E559 Vitamin D deficiency, unspecified: Secondary | ICD-10-CM | POA: Insufficient documentation

## 2018-03-05 HISTORY — DX: Nonrheumatic aortic (valve) stenosis: I35.0

## 2018-03-05 HISTORY — PX: COLONOSCOPY WITH PROPOFOL: SHX5780

## 2018-03-05 HISTORY — DX: Vitamin D deficiency, unspecified: E55.9

## 2018-03-05 HISTORY — DX: Hyperlipidemia, unspecified: E78.5

## 2018-03-05 HISTORY — DX: Heart failure, unspecified: I50.9

## 2018-03-05 HISTORY — DX: Nonrheumatic mitral (valve) insufficiency: I34.0

## 2018-03-05 HISTORY — DX: Hypothyroidism, unspecified: E03.9

## 2018-03-05 HISTORY — DX: Cardiac arrhythmia, unspecified: I49.9

## 2018-03-05 HISTORY — DX: Unspecified osteoarthritis, unspecified site: M19.90

## 2018-03-05 HISTORY — DX: Rheumatic tricuspid insufficiency: I07.1

## 2018-03-05 HISTORY — DX: Anxiety disorder, unspecified: F41.9

## 2018-03-05 SURGERY — COLONOSCOPY WITH PROPOFOL
Anesthesia: General

## 2018-03-05 MED ORDER — PROPOFOL 500 MG/50ML IV EMUL
INTRAVENOUS | Status: DC | PRN
  Administered 2018-03-05: 100 ug/kg/min via INTRAVENOUS

## 2018-03-05 MED ORDER — PROPOFOL 500 MG/50ML IV EMUL
INTRAVENOUS | Status: AC
  Filled 2018-03-05: qty 50

## 2018-03-05 MED ORDER — PIPERACILLIN-TAZOBACTAM 3.375 G IVPB
3.3750 g | Freq: Once | INTRAVENOUS | Status: AC
  Administered 2018-03-05: 3.375 g via INTRAVENOUS

## 2018-03-05 MED ORDER — FENTANYL CITRATE (PF) 100 MCG/2ML IJ SOLN
INTRAMUSCULAR | Status: AC
  Filled 2018-03-05: qty 2

## 2018-03-05 MED ORDER — MIDAZOLAM HCL 2 MG/2ML IJ SOLN
INTRAMUSCULAR | Status: AC
  Filled 2018-03-05: qty 2

## 2018-03-05 MED ORDER — MIDAZOLAM HCL 2 MG/2ML IJ SOLN
INTRAMUSCULAR | Status: DC | PRN
  Administered 2018-03-05: 1 mg via INTRAVENOUS

## 2018-03-05 MED ORDER — PIPERACILLIN-TAZOBACTAM 3.375 G IVPB
INTRAVENOUS | Status: AC
  Filled 2018-03-05: qty 50

## 2018-03-05 MED ORDER — SODIUM CHLORIDE 0.9 % IV SOLN
INTRAVENOUS | Status: DC
  Administered 2018-03-05: 1000 mL via INTRAVENOUS

## 2018-03-05 MED ORDER — EPHEDRINE SULFATE 50 MG/ML IJ SOLN
INTRAMUSCULAR | Status: AC
  Filled 2018-03-05: qty 1

## 2018-03-05 MED ORDER — EPHEDRINE SULFATE 50 MG/ML IJ SOLN
INTRAMUSCULAR | Status: DC | PRN
  Administered 2018-03-05 (×3): 10 mg via INTRAVENOUS

## 2018-03-05 MED ORDER — FENTANYL CITRATE (PF) 100 MCG/2ML IJ SOLN
INTRAMUSCULAR | Status: DC | PRN
  Administered 2018-03-05: 50 ug via INTRAVENOUS

## 2018-03-05 MED ORDER — SODIUM CHLORIDE 0.9 % IV SOLN: INTRAVENOUS | Status: DC

## 2018-03-05 NOTE — Anesthesia Preprocedure Evaluation (Addendum)
Anesthesia Evaluation  Patient identified by MRN, date of birth, ID band Patient awake    Reviewed: Allergy & Precautions, NPO status , Patient's Chart, lab work & pertinent test results  History of Anesthesia Complications Negative for: history of anesthetic complications  Airway Mallampati: II  TM Distance: >3 FB Neck ROM: Full    Dental no notable dental hx.    Pulmonary neg pulmonary ROS, neg sleep apnea, neg COPD,    breath sounds clear to auscultation- rhonchi (-) wheezing      Cardiovascular hypertension, Pt. on medications +CHF  (-) CAD, (-) Past MI, (-) Cardiac Stents and (-) CABG + pacemaker + Valvular Problems/Murmurs (s/p AVR 2007)  Rhythm:Regular Rate:Normal - Systolic murmurs and - Diastolic murmurs    Neuro/Psych Anxiety negative neurological ROS     GI/Hepatic negative GI ROS, Neg liver ROS,   Endo/Other  neg diabetesHypothyroidism   Renal/GU negative Renal ROS     Musculoskeletal  (+) Arthritis ,   Abdominal (+) - obese,   Peds  Hematology negative hematology ROS (+)   Anesthesia Other Findings Past Medical History: No date: Anxiety No date: Aortic stenosis No date: Arthritis No date: CHF (congestive heart failure) (HCC) No date: Dysrhythmia No date: Hyperlipidemia No date: Hypertension No date: Hypothyroidism No date: Mitral regurgitation No date: Tricuspid regurgitation No date: Vitamin D deficiency   Reproductive/Obstetrics                             Anesthesia Physical Anesthesia Plan  ASA: III  Anesthesia Plan: General   Post-op Pain Management:    Induction: Intravenous  PONV Risk Score and Plan: 2 and Propofol infusion  Airway Management Planned: Natural Airway  Additional Equipment:   Intra-op Plan:   Post-operative Plan:   Informed Consent: I have reviewed the patients History and Physical, chart, labs and discussed the procedure  including the risks, benefits and alternatives for the proposed anesthesia with the patient or authorized representative who has indicated his/her understanding and acceptance.   Dental advisory given  Plan Discussed with: CRNA and Anesthesiologist  Anesthesia Plan Comments:         Anesthesia Quick Evaluation

## 2018-03-05 NOTE — Anesthesia Postprocedure Evaluation (Signed)
Anesthesia Post Note  Patient: ASHER BABILONIA  Procedure(s) Performed: COLONOSCOPY WITH PROPOFOL (N/A )  Patient location during evaluation: Endoscopy Anesthesia Type: General Level of consciousness: awake and alert and oriented Pain management: pain level controlled Vital Signs Assessment: post-procedure vital signs reviewed and stable Respiratory status: spontaneous breathing, nonlabored ventilation and respiratory function stable Cardiovascular status: blood pressure returned to baseline and stable Postop Assessment: no signs of nausea or vomiting Anesthetic complications: no     Last Vitals:  Vitals:   03/05/18 1046 03/05/18 1103  BP:  (!) 94/47  Pulse:    Resp: 18   Temp: (!) 36.1 C   SpO2: 100%     Last Pain:  Vitals:   03/05/18 1103  TempSrc:   PainSc: 0-No pain                 Kyana Aicher

## 2018-03-05 NOTE — H&P (Signed)
Primary Care Physician:  Fayrene Helper, NP Primary Gastroenterologist:  Dr. Mechele Collin  Pre-Procedure History & Physical: HPI:  Kristin Ferrell is a 72 y.o. female is here for an colonoscopy.  This is for [facility] colon polyps.   Past Medical History:  Diagnosis Date  . Anxiety   . Aortic stenosis   . Arthritis   . CHF (congestive heart failure) (HCC)   . Dysrhythmia   . Hyperlipidemia   . Hypertension   . Hypothyroidism   . Mitral regurgitation   . Tricuspid regurgitation   . Vitamin D deficiency     Past Surgical History:  Procedure Laterality Date  . BREAST BIOPSY  22 +   stereo bx   . CARDIAC ELECTROPHYSIOLOGY STUDY AND ABLATION    . MECHANICAL AORTIC VALVE REPLACEMENT  2007  . pace maker  2015    Prior to Admission medications   Medication Sig Start Date End Date Taking? Authorizing Provider  furosemide (LASIX) 20 MG tablet Lasix 20 mg tablet  Take 1 tablet every day by oral route.   Yes [provider]  linaclotide (LINZESS) 72 MCG capsule Take by mouth.   Yes [provider]  lisinopril (PRINIVIL,ZESTRIL) 10 MG tablet Take by mouth. 12/23/16  Yes [provider]  metoprolol succinate (TOPROL-XL) 50 MG 24 hr tablet metoprolol succinate ER 50 mg tablet,extended release 24 hr 12/29/16  Yes [provider]  warfarin (COUMADIN) 6 MG tablet warfarin 6 mg tablet 07/03/13  Yes [provider]  Cholecalciferol (VITAMIN D3) 2000 units capsule Take by mouth.    [provider]  Pitavastatin Calcium (LIVALO) 2 MG TABS Livalo 2 mg tablet    [provider]    Allergies as of 12/11/2017 - never reviewed  Allergen Reaction Noted  . Hydralazine Other (See Comments) and Shortness Of Breath 05/01/2012    Family History  Problem Relation Age of Onset  . Breast cancer Neg Hx     Social History   Socioeconomic History  . Marital status: Married    Spouse name: Not on file  . Number of children: Not on file  . Years  of education: Not on file  . Highest education level: Not on file  Occupational History  . Not on file  Social Needs  . Financial resource strain: Not on file  . Food insecurity:    Worry: Not on file    Inability: Not on file  . Transportation needs:    Medical: Not on file    Non-medical: Not on file  Tobacco Use  . Smoking status: Never Smoker  . Smokeless tobacco: Never Used  Substance and Sexual Activity  . Alcohol use: No    Frequency: Never  . Drug use: No  . Sexual activity: Never    Partners: Male    Birth control/protection: None  Lifestyle  . Physical activity:    Days per week: 0 days    Minutes per session: 0 min  . Stress: Only a little  Relationships  . Social connections:    Talks on phone: More than three times a week    Gets together: Twice a week    Attends religious service: More than 4 times per year    Active member of club or organization: Yes    Attends meetings of clubs or organizations: More than 4 times per year    Relationship status: Married  . Intimate partner violence:    Fear of current or ex partner:  No    Emotionally abused: No    Physically abused: No    Forced sexual activity: No  Other Topics Concern  . Not on file  Social History Narrative  . Not on file    Review of Systems: See HPI, otherwise negative ROS  Physical Exam: BP 111/70   Pulse 76   Temp (!) 96.3 F (35.7 C) (Tympanic)   Resp 20   Ht 5' 5.5" (1.664 m)   Wt 69.4 kg (153 lb)   SpO2 100%   BMI 25.07 kg/m  General:   Alert,  pleasant and cooperative in NAD Head:  Normocephalic and atraumatic. Neck:  Supple; no masses or thyromegaly. Lungs:  Clear throughout to auscultation.    Heart:  Regular rate and rhythm. Abdomen:  Soft, nontender and nondistended. Normal bowel sounds, without guarding, and without rebound.   Neurologic:  Alert and  oriented x4;  grossly normal neurologically.  Impression/Plan: Kristin Ferrell is here for an colonoscopy to be  performed for [facility] colon polyps.  Risks, benefits, limitations, and alternatives regarding  colonoscopy have been reviewed with the patient.  Questions have been answered.  All parties agreeable.   Lynnae Prude, MD  03/05/2018, 10:05 AM

## 2018-03-05 NOTE — Transfer of Care (Signed)
Immediate Anesthesia Transfer of Care Note  Patient: Kristin Ferrell  Procedure(s) Performed: COLONOSCOPY WITH PROPOFOL (N/A )  Patient Location: PACU  Anesthesia Type:General  Level of Consciousness: awake and sedated  Airway & Oxygen Therapy: Patient Spontanous Breathing and Patient connected to nasal cannula oxygen  Post-op Assessment: Report given to RN and Post -op Vital signs reviewed and stable  Post vital signs: Reviewed and stable  Last Vitals:  Vitals Value Taken Time  BP    Temp    Pulse    Resp    SpO2      Last Pain:  Vitals:   03/05/18 0928  TempSrc: Tympanic  PainSc: 0-No pain         Complications: No apparent anesthesia complications

## 2018-03-05 NOTE — Op Note (Signed)
[facility] Gastroenterology Patient Name: Kristin Ferrell Procedure Date: 03/05/2018 10:03 AM MRN: [id number] Account #: [id number] Date of Birth: [birth year] Admit Type: Outpatient Age: 72 Room: [facility] ENDO ROOM 3 Gender: Female Note Status: Finalized Procedure:            Colonoscopy Providers:            Scot Jun, MD Referring MD:         Resa Miner. Marcelle Overlie (Referring MD) Medicines:            Propofol per Anesthesia Complications:        No immediate complications. Procedure:            Pre-Anesthesia Assessment:                       - After reviewing the risks and benefits, the patient                        was deemed in satisfactory condition to undergo the                        procedure.                       After obtaining informed consent, the colonoscope was                        passed under direct vision. Throughout the procedure,                        the patient's blood pressure, pulse, and oxygen                        saturations were monitored continuously. The                        Colonoscope was introduced through the anus and                        advanced to the the cecum, identified by appendiceal                        orifice and ileocecal valve. Findings:      A small polyp was found in the sigmoid colon. The polyp was       pedunculated. The polyp was removed with a hot snare. Resection and       retrieval were complete.      Internal hemorrhoids were found during endoscopy. The hemorrhoids were       small and Grade I (internal hemorrhoids that do not prolapse).      A few small-mouthed diverticula were found in the sigmoid colon.      The exam was otherwise without abnormality. Impression:           - One small polyp in the sigmoid colon, removed with a                        hot snare. Resected and retrieved.                       - Internal hemorrhoids.                       -  Diverticulosis in the sigmoid colon.                    - The examination was otherwise normal. Recommendation:       - Await pathology results. Scot Jun, MD 03/05/2018 10:42:36 AM This report has been signed electronically. Number of Addenda: 0 Note Initiated On: 03/05/2018 10:03 AM Scope Withdrawal Time: 0 hours 13 minutes 55 seconds  Total Procedure Duration: 0 hours 22 minutes 32 seconds       [facility]

## 2018-03-05 NOTE — Anesthesia Post-op Follow-up Note (Signed)
Anesthesia QCDR form completed.        

## 2018-03-06 ENCOUNTER — Encounter: Payer: Self-pay | Admitting: Unknown Physician Specialty

## 2018-03-06 LAB — SURGICAL PATHOLOGY

## 2018-12-24 ENCOUNTER — Other Ambulatory Visit: Payer: Self-pay | Admitting: Nurse Practitioner

## 2018-12-24 DIAGNOSIS — Z1231 Encounter for screening mammogram for malignant neoplasm of breast: Secondary | ICD-10-CM

## 2019-09-11 ENCOUNTER — Ambulatory Visit
Admission: RE | Admit: 2019-09-11 | Discharge: 2019-09-11 | Disposition: A | Discharge status: Home / Self Care | Payer: Managed Care, Other (non HMO) | Source: Ambulatory Visit | Attending: Nurse Practitioner | Admitting: Nurse Practitioner

## 2019-09-11 DIAGNOSIS — Z1231 Encounter for screening mammogram for malignant neoplasm of breast: Secondary | ICD-10-CM | POA: Diagnosis not present

## 2019-12-22 ENCOUNTER — Ambulatory Visit: Payer: Managed Care, Other (non HMO) | Attending: Internal Medicine

## 2019-12-22 DIAGNOSIS — Z23 Encounter for immunization: Secondary | ICD-10-CM

## 2019-12-22 NOTE — Progress Notes (Signed)
   Covid-19 Vaccination Clinic  Name:  Kristin Ferrell    MRN: [id number] DOB: 11/02/1946  12/22/2019  [name] was observed post Covid-19 immunization for 15 minutes without incidence. She was provided with Vaccine Information Sheet and instruction to access the V-Safe system.   [name] was instructed to call 911 with any severe reactions post vaccine: Marland Kitchen Difficulty breathing  . Swelling of your face and throat  . A fast heartbeat  . A bad rash all over your body  . Dizziness and weakness    Immunizations Administered    Name Date Dose VIS Date Route   Pfizer COVID-19 Vaccine 12/22/2019  9:46 AM 0.3 mL 10/18/2019 Intramuscular   Manufacturer: [employer], [employer]   Lot: [id number]   NDC: [address]-1

## 2020-01-14 ENCOUNTER — Ambulatory Visit: Payer: Managed Care, Other (non HMO) | Attending: Internal Medicine

## 2020-01-14 DIAGNOSIS — Z23 Encounter for immunization: Secondary | ICD-10-CM | POA: Insufficient documentation

## 2020-01-14 NOTE — Progress Notes (Signed)
   Covid-19 Vaccination Clinic  Name:  Kristin Ferrell    MRN: [id number] DOB: 10/03/1946  01/14/2020  Kristin Ferrell was observed post Covid-19 immunization for 15 minutes without incident. She was provided with Vaccine Information Sheet and instruction to access the V-Safe system.   Kristin Ferrell was instructed to call 911 with any severe reactions post vaccine: Marland Kitchen Difficulty breathing  . Swelling of face and throat  . A fast heartbeat  . A bad rash all over body  . Dizziness and weakness   Immunizations Administered    Name Date Dose VIS Date Route   Pfizer COVID-19 Vaccine 01/14/2020  9:28 AM 0.3 mL 10/18/2019 Intramuscular   Manufacturer: [employer], [employer]   Lot: [id number]   NDC: [address]-4

## 2020-08-19 ENCOUNTER — Other Ambulatory Visit: Payer: Self-pay | Admitting: Nurse Practitioner

## 2020-08-19 DIAGNOSIS — Z1231 Encounter for screening mammogram for malignant neoplasm of breast: Secondary | ICD-10-CM

## 2020-09-23 ENCOUNTER — Ambulatory Visit
Admission: RE | Admit: 2020-09-23 | Discharge: 2020-09-23 | Disposition: A | Discharge status: Home / Self Care | Payer: Managed Care, Other (non HMO) | Source: Ambulatory Visit | Attending: Nurse Practitioner | Admitting: Nurse Practitioner

## 2020-09-23 ENCOUNTER — Other Ambulatory Visit: Payer: Self-pay

## 2020-09-23 DIAGNOSIS — Z1231 Encounter for screening mammogram for malignant neoplasm of breast: Secondary | ICD-10-CM | POA: Insufficient documentation

## 2021-08-09 ENCOUNTER — Other Ambulatory Visit: Payer: Self-pay | Admitting: Nurse Practitioner

## 2021-08-09 DIAGNOSIS — Z1231 Encounter for screening mammogram for malignant neoplasm of breast: Secondary | ICD-10-CM

## 2021-09-27 ENCOUNTER — Ambulatory Visit
Admission: RE | Admit: 2021-09-27 | Discharge: 2021-09-27 | Disposition: A | Discharge status: Home / Self Care | Payer: Managed Care, Other (non HMO) | Source: Ambulatory Visit | Attending: Nurse Practitioner | Admitting: Nurse Practitioner

## 2021-09-27 ENCOUNTER — Other Ambulatory Visit: Payer: Self-pay

## 2021-09-27 DIAGNOSIS — Z1231 Encounter for screening mammogram for malignant neoplasm of breast: Secondary | ICD-10-CM | POA: Diagnosis present

## 2021-10-07 ENCOUNTER — Other Ambulatory Visit: Payer: Self-pay | Admitting: Nephrology

## 2021-10-07 DIAGNOSIS — E785 Hyperlipidemia, unspecified: Secondary | ICD-10-CM

## 2021-10-07 DIAGNOSIS — R8290 Unspecified abnormal findings in urine: Secondary | ICD-10-CM

## 2021-10-07 DIAGNOSIS — I480 Paroxysmal atrial fibrillation: Secondary | ICD-10-CM | POA: Insufficient documentation

## 2021-10-07 DIAGNOSIS — N1831 Chronic kidney disease, stage 3a: Secondary | ICD-10-CM

## 2021-10-20 ENCOUNTER — Ambulatory Visit: Payer: Medicare Other

## 2022-07-05 ENCOUNTER — Other Ambulatory Visit: Payer: Self-pay | Admitting: Nurse Practitioner

## 2022-07-05 DIAGNOSIS — Z1231 Encounter for screening mammogram for malignant neoplasm of breast: Secondary | ICD-10-CM

## 2022-07-11 DIAGNOSIS — N183 Chronic kidney disease, stage 3 unspecified: Secondary | ICD-10-CM | POA: Insufficient documentation

## 2022-12-07 ENCOUNTER — Other Ambulatory Visit: Payer: Self-pay | Admitting: Cardiovascular Disease

## 2022-12-08 LAB — PROTIME-INR
INR: 2 — ABNORMAL HIGH (ref 0.9–1.2)
Prothrombin Time: 20.8 s — ABNORMAL HIGH (ref 9.1–12.0)

## 2022-12-11 ENCOUNTER — Other Ambulatory Visit: Payer: Self-pay | Admitting: Nurse Practitioner

## 2022-12-11 ENCOUNTER — Other Ambulatory Visit: Payer: Self-pay | Admitting: Cardiovascular Disease

## 2022-12-12 ENCOUNTER — Other Ambulatory Visit: Payer: Self-pay

## 2022-12-17 ENCOUNTER — Other Ambulatory Visit: Payer: Self-pay | Admitting: Cardiovascular Disease

## 2022-12-19 ENCOUNTER — Other Ambulatory Visit: Payer: Self-pay

## 2022-12-19 MED ORDER — METOPROLOL SUCCINATE ER 100 MG PO TB24: 100.0000 mg | ORAL_TABLET | Freq: Every day | ORAL | 3 refills | Status: DC

## 2022-12-30 ENCOUNTER — Telehealth: Payer: Self-pay

## 2022-12-30 ENCOUNTER — Other Ambulatory Visit: Payer: Self-pay | Admitting: Nurse Practitioner

## 2022-12-30 MED ORDER — TIZANIDINE HCL 4 MG PO TABS: 4.0000 mg | ORAL_TABLET | Freq: Every day | ORAL | 1 refills | Status: DC

## 2022-12-30 NOTE — Telephone Encounter (Signed)
Patient states she fell about 2 year ago on her  L Shoulder and she's having some achy pains starting in her shoulder and running into her arm but its the muscles and she's not having chest pain she is asking for a muscle relaxer if possible

## 2023-01-06 ENCOUNTER — Other Ambulatory Visit: Payer: Self-pay

## 2023-01-06 MED ORDER — WARFARIN SODIUM 6 MG PO TABS: ORAL_TABLET | ORAL | 1 refills | Status: DC

## 2023-01-09 ENCOUNTER — Other Ambulatory Visit: Payer: Self-pay

## 2023-01-09 MED ORDER — WARFARIN SODIUM 6 MG PO TABS: ORAL_TABLET | ORAL | 1 refills | Status: DC

## 2023-01-12 ENCOUNTER — Other Ambulatory Visit: Payer: Self-pay | Admitting: Nurse Practitioner

## 2023-01-12 ENCOUNTER — Telehealth: Payer: Self-pay

## 2023-01-12 ENCOUNTER — Ambulatory Visit (INDEPENDENT_AMBULATORY_CARE_PROVIDER_SITE_OTHER): Payer: Managed Care, Other (non HMO)

## 2023-01-12 DIAGNOSIS — M25512 Pain in left shoulder: Secondary | ICD-10-CM | POA: Diagnosis not present

## 2023-01-12 NOTE — Telephone Encounter (Signed)
Patient is asking for an xray of her L shoulder states its really been bothering her and she's been taking the muscle relaxor but only half bc of how sleepy it makes her

## 2023-01-30 ENCOUNTER — Other Ambulatory Visit: Payer: Self-pay | Admitting: Nurse Practitioner

## 2023-01-30 ENCOUNTER — Other Ambulatory Visit: Payer: Self-pay | Admitting: Cardiovascular Disease

## 2023-01-30 DIAGNOSIS — I10 Essential (primary) hypertension: Secondary | ICD-10-CM

## 2023-01-30 DIAGNOSIS — E785 Hyperlipidemia, unspecified: Secondary | ICD-10-CM

## 2023-01-30 DIAGNOSIS — R7301 Impaired fasting glucose: Secondary | ICD-10-CM

## 2023-01-31 ENCOUNTER — Encounter: Payer: Self-pay | Admitting: Nurse Practitioner

## 2023-01-31 ENCOUNTER — Ambulatory Visit (INDEPENDENT_AMBULATORY_CARE_PROVIDER_SITE_OTHER): Payer: Managed Care, Other (non HMO) | Admitting: Nurse Practitioner

## 2023-01-31 ENCOUNTER — Encounter: Payer: Self-pay | Admitting: Cardiovascular Disease

## 2023-01-31 VITALS — BP 106/70 | HR 70 | Ht 66.5 in | Wt 149.0 lb

## 2023-01-31 DIAGNOSIS — R7303 Prediabetes: Secondary | ICD-10-CM

## 2023-01-31 DIAGNOSIS — K5900 Constipation, unspecified: Secondary | ICD-10-CM | POA: Diagnosis not present

## 2023-01-31 DIAGNOSIS — E782 Mixed hyperlipidemia: Secondary | ICD-10-CM

## 2023-01-31 DIAGNOSIS — J309 Allergic rhinitis, unspecified: Secondary | ICD-10-CM | POA: Diagnosis not present

## 2023-01-31 LAB — LIPID PANEL
Chol/HDL Ratio: 2.7 ratio (ref 0.0–4.4)
Cholesterol, Total: 174 mg/dL (ref 100–199)
HDL: 64 mg/dL (ref >39)
LDL Chol Calc (NIH): 90 mg/dL (ref 0–99)
Triglycerides: 112 mg/dL (ref 0–149)
VLDL Cholesterol Cal: 20 mg/dL (ref 5–40)

## 2023-01-31 LAB — CMP14+EGFR
ALT: 11 IU/L (ref 0–32)
AST: 23 IU/L (ref 0–40)
Albumin/Globulin Ratio: 1.9 (ref 1.2–2.2)
Albumin: 4.4 g/dL (ref 3.8–4.8)
Alkaline Phosphatase: 66 IU/L (ref 44–121)
BUN/Creatinine Ratio: 10 — ABNORMAL LOW (ref 12–28)
BUN: 10 mg/dL (ref 8–27)
Bilirubin Total: 1.3 mg/dL — ABNORMAL HIGH (ref 0.0–1.2)
CO2: 21 mmol/L (ref 20–29)
Calcium: 9.5 mg/dL (ref 8.7–10.3)
Chloride: 103 mmol/L (ref 96–106)
Creatinine, Ser: 1.03 mg/dL — ABNORMAL HIGH (ref 0.57–1.00)
Globulin, Total: 2.3 g/dL (ref 1.5–4.5)
Glucose: 107 mg/dL — ABNORMAL HIGH (ref 70–99)
Potassium: 4.4 mmol/L (ref 3.5–5.2)
Sodium: 140 mmol/L (ref 134–144)
Total Protein: 6.7 g/dL (ref 6.0–8.5)
eGFR: 57 mL/min/{1.73_m2} — ABNORMAL LOW (ref >59)

## 2023-01-31 LAB — CBC WITH DIFFERENTIAL/PLATELET
Basophils Absolute: 0 10*3/uL (ref 0.0–0.2)
Basos: 1 %
EOS (ABSOLUTE): 0.1 10*3/uL (ref 0.0–0.4)
Eos: 2 %
Hematocrit: 41.8 % (ref 34.0–46.6)
Hemoglobin: 13.8 g/dL (ref 11.1–15.9)
Immature Grans (Abs): 0 10*3/uL (ref 0.0–0.1)
Immature Granulocytes: 0 %
Lymphocytes Absolute: 1.5 10*3/uL (ref 0.7–3.1)
Lymphs: 25 %
MCH: 30.4 pg (ref 26.6–33.0)
MCHC: 33 g/dL (ref 31.5–35.7)
MCV: 92 fL (ref 79–97)
Monocytes Absolute: 0.5 10*3/uL (ref 0.1–0.9)
Monocytes: 8 %
Neutrophils Absolute: 3.8 10*3/uL (ref 1.4–7.0)
Neutrophils: 64 %
Platelets: 183 10*3/uL (ref 150–450)
RBC: 4.54 x10E6/uL (ref 3.77–5.28)
RDW: 13.1 % (ref 11.7–15.4)
WBC: 5.9 10*3/uL (ref 3.4–10.8)

## 2023-01-31 LAB — PROTIME-INR
INR: 2 — ABNORMAL HIGH (ref 0.9–1.2)
Prothrombin Time: 20.9 s — ABNORMAL HIGH (ref 9.1–12.0)

## 2023-01-31 LAB — HEMOGLOBIN A1C
Est. average glucose Bld gHb Est-mCnc: 120 mg/dL
Hgb A1c MFr Bld: 5.8 % — ABNORMAL HIGH (ref 4.8–5.6)

## 2023-01-31 LAB — TSH: TSH: 2.71 u[IU]/mL (ref 0.450–4.500)

## 2023-01-31 NOTE — Patient Instructions (Signed)
1) Follow up appt in 3 months, 2 weeks,fasting labs prior

## 2023-01-31 NOTE — Progress Notes (Signed)
Established Patient Office Visit  Subjective:  Patient ID: Kristin Ferrell, female    DOB: Jan 24, 1946  Age: 77 y.o. MRN: [id number]  Chief Complaint  Patient presents with   Follow-up    Lab results    Lab results review, A1c at 5.8%, LDL at 90 mg/dl so gave patient low chol diet.  Recently saw nephrology, reported eGFR is 60.  Continues to use voltaren gel to her shoulders.    No other concerns at this time.   Past Medical History:  Diagnosis Date   Anxiety    Aortic stenosis    Arthritis    CHF (congestive heart failure) (HCC)    Dysrhythmia    Hyperlipidemia    Hypertension    Hypothyroidism    Mitral regurgitation    Tricuspid regurgitation    Vitamin D deficiency     Past Surgical History:  Procedure Laterality Date   BREAST BIOPSY  90s   stereo bx    CARDIAC ELECTROPHYSIOLOGY STUDY AND ABLATION     COLONOSCOPY WITH PROPOFOL N/A 03/05/2018   Procedure: COLONOSCOPY WITH PROPOFOL;  Surgeon: Manya Silvas, MD;  Location: [facility] ENDOSCOPY;  Service: Endoscopy;  Laterality: N/A;   MECHANICAL AORTIC VALVE REPLACEMENT  2007   pace maker  2015    Social History   Socioeconomic History   Marital status: Married    Spouse name: Not on file   Number of children: Not on file   Years of education: Not on file   Highest education level: Not on file  Occupational History   Not on file  Tobacco Use   Smoking status: Never   Smokeless tobacco: Never  Vaping Use   Vaping Use: Never used  Substance and Sexual Activity   Alcohol use: No   Drug use: No   Sexual activity: Never    Partners: Male    Birth control/protection: None  Other Topics Concern   Not on file  Social History Narrative   Not on file   Social Determinants of Health   Financial Resource Strain: Not on file  Food Insecurity: Not on file  Transportation Needs: Not on file  Physical Activity: Inactive (09/11/2019)   Exercise Vital Sign    Days of Exercise per Week: 0 days    Minutes of  Exercise per Session: 0 min  Stress: No Stress Concern Present (09/11/2019)   [place]    Feeling of Stress : Only a little  Social Connections: Socially Integrated (09/11/2019)   Social Connection and Isolation Panel [NHANES]    Frequency of Communication with Friends and Family: More than three times a week    Frequency of Social Gatherings with Friends and Family: Twice a week    Attends Religious Services: More than 4 times per year    Active Member of [employer] or Organizations: Yes    Attends Music therapist: More than 4 times per year    Marital Status: Married  Human resources officer Violence: Not At Risk (09/11/2019)   Humiliation, Afraid, Rape, and Kick questionnaire    Fear of Current or Ex-Partner: No    Emotionally Abused: No    Physically Abused: No    Sexually Abused: No    Family History  Problem Relation Age of Onset   Breast cancer Neg Hx     Allergies  Allergen Reactions   Hydralazine Other (See Comments) and Shortness Of Breath    hyptoension  Review of Systems  Constitutional: Negative.   HENT: Negative.    Eyes: Negative.   Respiratory: Negative.    Cardiovascular: Negative.   Gastrointestinal: Negative.   Genitourinary: Negative.   Musculoskeletal:  Positive for myalgias.  Skin: Negative.   Neurological: Negative.   Endo/Heme/Allergies: Negative.   Psychiatric/Behavioral: Negative.         Objective:   BP 106/70   Pulse 70   Ht 5' 6.5" (1.689 m)   Wt 149 lb (67.6 kg)   SpO2 98%   BMI 23.69 kg/m   Vitals:   01/31/23 1253  BP: 106/70  Pulse: 70  Height: 5' 6.5" (1.689 m)  Weight: 149 lb (67.6 kg)  SpO2: 98%  BMI (Calculated): 23.69    Physical Exam Vitals reviewed.  Constitutional:      Appearance: Normal appearance.  HENT:     Head: Normocephalic.     Nose: Nose normal.     Mouth/Throat:     Mouth: Mucous membranes are moist.  Eyes:     Pupils:  Pupils are equal, round, and reactive to light.  Cardiovascular:     Rate and Rhythm: Normal rate and regular rhythm.  Pulmonary:     Effort: Pulmonary effort is normal.     Breath sounds: Normal breath sounds.  Abdominal:     General: Bowel sounds are normal.     Palpations: Abdomen is soft.  Musculoskeletal:        General: Tenderness present.     Cervical back: Normal range of motion and neck supple.  Skin:    General: Skin is warm and dry.  Neurological:     Mental Status: She is alert and oriented to person, place, and time.  Psychiatric:        Mood and Affect: Mood normal.        Behavior: Behavior normal.      No results found for any visits on 01/31/23.  Recent Results (from the past 2160 hour(s))  Protime-INR     Status: Abnormal   Collection Time: 12/07/22  3:35 PM  Result Value Ref Range   INR 2.0 (H) 0.9 - 1.2    Comment: Reference interval is for non-anticoagulated patients. Suggested INR therapeutic range for Vitamin K antagonist therapy:    Standard Dose (moderate intensity                   therapeutic range):       2.0 - 3.0    Higher intensity therapeutic range       2.5 - 3.5    Prothrombin Time 20.8 (H) 9.1 - 12.0 sec  Hemoglobin A1c     Status: Abnormal   Collection Time: 01/30/23 10:02 AM  Result Value Ref Range   Hgb A1c MFr Bld 5.8 (H) 4.8 - 5.6 %    Comment:          Prediabetes: 5.7 - 6.4          Diabetes: >6.4          Glycemic control for adults with diabetes: <7.0    Est. average glucose Bld gHb Est-mCnc 120 mg/dL  TSH     Status: None   Collection Time: 01/30/23 10:02 AM  Result Value Ref Range   TSH 2.710 0.450 - 4.500 uIU/mL  CMP14+EGFR     Status: Abnormal   Collection Time: 01/30/23 10:02 AM  Result Value Ref Range   Glucose 107 (H) 70 - 99 mg/dL   BUN 10  8 - 27 mg/dL   Creatinine, Ser 1.03 (H) 0.57 - 1.00 mg/dL   eGFR 57 (L) >59 mL/min/1.73   BUN/Creatinine Ratio 10 (L) 12 - 28   Sodium 140 134 - 144 mmol/L   Potassium  4.4 3.5 - 5.2 mmol/L   Chloride 103 96 - 106 mmol/L   CO2 21 20 - 29 mmol/L   Calcium 9.5 8.7 - 10.3 mg/dL   Total Protein 6.7 6.0 - 8.5 g/dL   Albumin 4.4 3.8 - 4.8 g/dL   Globulin, Total 2.3 1.5 - 4.5 g/dL   Albumin/Globulin Ratio 1.9 1.2 - 2.2   Bilirubin Total 1.3 (H) 0.0 - 1.2 mg/dL   Alkaline Phosphatase 66 44 - 121 IU/L   AST 23 0 - 40 IU/L   ALT 11 0 - 32 IU/L  Lipid panel     Status: None   Collection Time: 01/30/23 10:02 AM  Result Value Ref Range   Cholesterol, Total 174 100 - 199 mg/dL   Triglycerides 112 0 - 149 mg/dL   HDL 64 >39 mg/dL   VLDL Cholesterol Cal 20 5 - 40 mg/dL   LDL Chol Calc (NIH) 90 0 - 99 mg/dL   Chol/HDL Ratio 2.7 0.0 - 4.4 ratio    Comment:                                   T. Chol/HDL Ratio                                             Men  Women                               1/2 Avg.Risk  3.4    3.3                                   Avg.Risk  5.0    4.4                                2X Avg.Risk  9.6    7.1                                3X Avg.Risk 23.4   11.0   CBC with Diff     Status: None   Collection Time: 01/30/23 10:02 AM  Result Value Ref Range   WBC 5.9 3.4 - 10.8 x10E3/uL   RBC 4.54 3.77 - 5.28 x10E6/uL   Hemoglobin 13.8 11.1 - 15.9 g/dL   Hematocrit 41.8 34.0 - 46.6 %   MCV 92 79 - 97 fL   MCH 30.4 26.6 - 33.0 pg   MCHC 33.0 31.5 - 35.7 g/dL   RDW 13.1 11.7 - 15.4 %   Platelets 183 150 - 450 x10E3/uL   Neutrophils 64 Not Estab. %   Lymphs 25 Not Estab. %   Monocytes 8 Not Estab. %   Eos 2 Not Estab. %   Basos 1 Not Estab. %   Neutrophils Absolute 3.8 1.4 - 7.0 x10E3/uL   Lymphocytes Absolute  1.5 0.7 - 3.1 x10E3/uL   Monocytes Absolute 0.5 0.1 - 0.9 x10E3/uL   EOS (ABSOLUTE) 0.1 0.0 - 0.4 x10E3/uL   Basophils Absolute 0.0 0.0 - 0.2 x10E3/uL   Immature Granulocytes 0 Not Estab. %   Immature Grans (Abs) 0.0 0.0 - 0.1 x10E3/uL  Protime-INR     Status: Abnormal   Collection Time: 01/30/23 10:05 AM  Result Value Ref Range    INR 2.0 (H) 0.9 - 1.2    Comment: Reference interval is for non-anticoagulated patients. Suggested INR therapeutic range for Vitamin K antagonist therapy:    Standard Dose (moderate intensity                   therapeutic range):       2.0 - 3.0    Higher intensity therapeutic range       2.5 - 3.5    Prothrombin Time 20.9 (H) 9.1 - 12.0 sec      Assessment & Plan:   Problem List Items Addressed This Visit       Respiratory   Allergic rhinitis     Other   Prediabetes - Primary   Constipation   Mixed hyperlipidemia    Return in about 3 months (around 05/03/2023).   Total time spent: 35 minutes  Evern Bio, NP  01/31/2023

## 2023-02-13 ENCOUNTER — Other Ambulatory Visit: Payer: Self-pay

## 2023-02-13 MED ORDER — WARFARIN SODIUM 6 MG PO TABS: ORAL_TABLET | ORAL | 1 refills | Status: DC

## 2023-03-02 ENCOUNTER — Encounter: Payer: Self-pay | Admitting: Cardiovascular Disease

## 2023-03-02 ENCOUNTER — Ambulatory Visit (INDEPENDENT_AMBULATORY_CARE_PROVIDER_SITE_OTHER): Payer: Managed Care, Other (non HMO) | Admitting: Cardiovascular Disease

## 2023-03-02 VITALS — BP 90/60 | HR 60 | Ht 65.5 in | Wt 148.0 lb

## 2023-03-02 DIAGNOSIS — I350 Nonrheumatic aortic (valve) stenosis: Secondary | ICD-10-CM | POA: Diagnosis not present

## 2023-03-02 DIAGNOSIS — E782 Mixed hyperlipidemia: Secondary | ICD-10-CM

## 2023-03-02 DIAGNOSIS — K30 Functional dyspepsia: Secondary | ICD-10-CM | POA: Diagnosis not present

## 2023-03-02 DIAGNOSIS — Z952 Presence of prosthetic heart valve: Secondary | ICD-10-CM

## 2023-03-02 DIAGNOSIS — Z9889 Other specified postprocedural states: Secondary | ICD-10-CM

## 2023-03-02 DIAGNOSIS — Z950 Presence of cardiac pacemaker: Secondary | ICD-10-CM

## 2023-03-02 DIAGNOSIS — Z8679 Personal history of other diseases of the circulatory system: Secondary | ICD-10-CM

## 2023-03-02 DIAGNOSIS — I509 Heart failure, unspecified: Secondary | ICD-10-CM | POA: Insufficient documentation

## 2023-03-02 DIAGNOSIS — I50814 Right heart failure due to left heart failure: Secondary | ICD-10-CM | POA: Diagnosis not present

## 2023-03-02 MED ORDER — PANTOPRAZOLE SODIUM 40 MG PO TBEC: 40.0000 mg | DELAYED_RELEASE_TABLET | Freq: Every day | ORAL | 1 refills | Status: DC

## 2023-03-02 NOTE — Progress Notes (Signed)
Cardiology Office Note   Date:  03/02/2023   ID:  Kristin Ferrell, Kristin Ferrell 1946-10-23, MRN [id number]  PCP:  Orson Eva, NP  Cardiologist:  Adrian Blackwater, MD      History of Present Illness: Kristin Ferrell is a 77 y.o. female who presents for  Chief Complaint  Patient presents with   Follow-up    Follow up    Having indigestion radiating to back      Past Medical History:  Diagnosis Date   Anxiety    Aortic stenosis    Arthritis    CHF (congestive heart failure)    Dysrhythmia    Hyperlipidemia    Hypertension    Hypothyroidism    Mitral regurgitation    Tricuspid regurgitation    Vitamin D deficiency      Past Surgical History:  Procedure Laterality Date   BREAST BIOPSY  90s   stereo bx    CARDIAC ELECTROPHYSIOLOGY STUDY AND ABLATION     COLONOSCOPY WITH PROPOFOL N/A 03/05/2018   Procedure: COLONOSCOPY WITH PROPOFOL;  Surgeon: Scot Jun, MD;  Location: [facility] ENDOSCOPY;  Service: Endoscopy;  Laterality: N/A;   MECHANICAL AORTIC VALVE REPLACEMENT  2007   pace maker  2015     Current Outpatient Medications  Medication Sig Dispense Refill   Cholecalciferol (VITAMIN D3) 2000 units capsule Take by mouth.     fluticasone (FLONASE) 50 MCG/ACT nasal spray Place 1 spray into both nostrils daily.     furosemide (LASIX) 20 MG tablet TAKE 1 TABLET BY MOUTH EVERY DAY 90 tablet 1   lisinopril (ZESTRIL) 5 MG tablet TAKE 1 TABLET BY MOUTH DAILY 90 tablet 1   metoprolol succinate (TOPROL-XL) 100 MG 24 hr tablet Take 1 tablet (100 mg total) by mouth daily. (Patient taking differently: Take 100 mg by mouth daily. Take 1/2 tablet.) 90 tablet 3   pantoprazole (PROTONIX) 40 MG tablet Take 1 tablet (40 mg total) by mouth daily. 30 tablet 1   Pitavastatin Calcium (LIVALO) 2 MG TABS Livalo 2 mg tablet     warfarin (COUMADIN) 6 MG tablet Take one tablet by mouth once daily 90 tablet 1   linaclotide (LINZESS) 72 MCG capsule Take by mouth. (Patient not taking: Reported on  03/02/2023)     tiZANidine (ZANAFLEX) 4 MG tablet Take 1 tablet (4 mg total) by mouth daily. (Patient not taking: Reported on 03/02/2023) 30 tablet 1   No current facility-administered medications for this visit.    Allergies:   Hydralazine    Social History:   reports that she has never smoked. She has never used smokeless tobacco. She reports that she does not drink alcohol and does not use drugs.   Family History:  family history is not on file.    ROS:     Review of Systems  Constitutional: Negative.   HENT: Negative.    Eyes: Negative.   Respiratory: Negative.    Cardiovascular: Negative.   Gastrointestinal: Negative.   Genitourinary: Negative.   Musculoskeletal: Negative.   Skin: Negative.   Neurological: Negative.   Endo/Heme/Allergies: Negative.   Psychiatric/Behavioral: Negative.    All other systems reviewed and are negative.   All other systems are reviewed and negative.   PHYSICAL EXAM: VS:  BP 90/60   Pulse 60   Wt 148 lb (67.1 kg)   SpO2 98%   BMI 23.53 kg/m  , BMI Body mass index is 23.53 kg/m. Last weight:  Wt Readings from Last 3 Encounters:  03/02/23 148 lb (67.1 kg)  01/31/23 149 lb (67.6 kg)  03/05/18 153 lb (69.4 kg)     Physical Exam Constitutional:      Appearance: Normal appearance.  Cardiovascular:     Rate and Rhythm: Normal rate and regular rhythm.     Heart sounds: Normal heart sounds.  Pulmonary:     Effort: Pulmonary effort is normal.     Breath sounds: Normal breath sounds.  Musculoskeletal:     Right lower leg: No edema.     Left lower leg: No edema.  Neurological:     Mental Status: She is alert.      EKG: afib 70/mi, VVI PMB  Recent Labs: 01/30/2023: ALT 11; BUN 10; Creatinine, Ser 1.03; Hemoglobin 13.8; Platelets 183; Potassium 4.4; Sodium 140; TSH 2.710    Lipid Panel    Component Value Date/Time   CHOL 174 01/30/2023 1002   TRIG 112 01/30/2023 1002   HDL 64 01/30/2023 1002   CHOLHDL 2.7 01/30/2023 1002    LDLCALC 90 01/30/2023 1002      Other studies Reviewed: Patient: 307.0 Duard Brady DOB:  08/03/1946  Date:  11/03/2021 07:30 Provider: Adrian Blackwater MD Encounter: NUCLEAR STRESS TEST   Page 1 TESTS                                                                                          [facility] [address] [place], [place] [address] [phone] STUDY:  Gated Stress / Rest Myocardial Perfusion Imaging Tomographic (SPECT) Including attenuation correction Wall Motion, Left Ventricular Ejection Fraction By Gated Technique.Treadmill Stress Test. SEX: Female  WEIGHT: 148 lbs   HEIGHT: 65 in   ARMS UP: YES/NO                                                                        REFERRING PHYSICIAN: Dr.Todd Argabright Welton Flakes  INDICATION FOR STUDY:  CP                                                                                                                                                                                                                    TECHNIQUE:  Approximately 20 minutes following the intravenous administration of 10.8 mCi of Tc-28m Sestamibi after stress testing in a reclined supine position with arms above their head if able to do so, gated SPECT imaging of the heart was performed. After about a 2hr break, the patient was injected intravenously with 32.0 mCi of Tc-30m Sestamibi.  Approximately 45 minutes later in the same position as stress imaging SPECT rest imaging of the heart was performed.  STRESS BY:  Adrian Blackwater, MD PROTOCOL:  Smitty Cords                                                                                        MAX PRED HR: 147                     85%: 125               75%: 110                                                                                                                    RESTING BP: 118/72  RESTING HR: 80  PEAK BP: 132/80  PEAK HR: 105 (71%)  EXERCISE DURATION: 5:00                                             METS:  7.0    REASON FOR TEST TERMINATION:  Fatigue. SOB.                                                                                                                                 SYMPTOMS: Fatigue. SOB.  DUKE TREADMILL SCORE: 5                                       RISK: Low                                                                                                                                                                                                            EKG RESULTS:  VVI paced rhythm. 81/min. No significant ST changes at peak exercise.                                                             IMAGE QUALITY: Good  PERFUSION/WALL MOTION FINDINGS: EF = 86%. No perfusion defects, normal wall motion.                                                                           IMPRESSION:  Normal stress test with normal LVEF.                                                                                                                                                                                                                                                                                        Adrian Blackwater, MD Stress Interpreting Physician / Nuclear Interpreting Physician  Adrian Blackwater MD  Electronically signed by: Adrian Blackwater     Date: 11/11/2021 09:30  Patient: 307.0 - Duard Brady DOB:  09/05/1946  Date:  04/19/2021 13:30 Provider: Adrian Blackwater MD Encounter: ECHO   Page 2 REASON FOR VISIT  Visit for: Echocardiogram/Shortness of breath  Sex:    female   wt=  158  lbs.  BP= 128/76  Height=  65.5  inches.   TESTS  Imaging: Echocardiogram:  An echocardiogram in (2-d) mode was performed and in Doppler mode with color flow velocity mapping was performed. Aortic valve flow velocity 2.1   m/s and systolic calculated mean flow gradient 8  mmHg. Mitral valve diastolic peak flow velocity E 0.6   m/s and E/A ratio 2.3. Aortic root diameter 3.1   cm. The LVOT internal diameter 1.9  cm and flow velocity was abnormal 0.6    m/s. LV systolic dimension 2.3   cm, diastolic 3.7  cm, posterior wall thickness 1.0   cm, fractional shortening 36 %, and EF 60 %. IVS thickness 1.0  cm. LA dimension 4.8 cm  RIGHT atrium=  20.2  cm2. Mitral Valve =  Ea= 8.3  DT= 236 msec. Tricuspid Valve =  TR jet V=  2.7  RAP=5  RVSP= 36    mmHg. Pulmonic Valve= PIEDV=   1.2   m/s. Aortic Valve has Mild Regurgitation. Mitral Valve has Mild/Moderate Regurgitation. Pulmonic Valve has Mild Regurgitation. Tricuspid Valve has Mild Regurgitation.     ASSESSMENT  Technically adequate study.  Ejection fraction-60  Left Ventricle- Normal size   Left Ventricle diastolic dysfunction grade-Normal  Right Ventricle- Mildly dilated  Normal right ventricular wall motion  Left Atrium-Severely dilated  Right Atrium-Mildly dilated  Aortic valve- Mechanical Aortic valve replacement (St. Jude), Mild stenosis, Mild regurgitation  Pulmonic Valve-No stenosis, Mild regurgitation  Mitral Valve-Mild/moderate regurgitation  Tricuspid Valve-Mild Regurgitation  No Pericardial effusion.   THERAPY   Referring physician: Laurier Nancy  Sonographer: Lenor Derrick.  Adrian Blackwater MD  Electronically signed by: Adrian Blackwater     Date: 04/27/2021 09:12    ASSESSMENT AND PLAN:    ICD-10-CM   1.  Indigestion  K30 pantoprazole (PROTONIX) 40 MG tablet   protonix bid for few days then once a day    2. Nonrheumatic aortic valve stenosis  I35.0 pantoprazole (PROTONIX) 40 MG tablet    3. 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 Cardiac pacemaker in situ  Z95.0 pantoprazole (PROTONIX) 40 MG tablet    5. Mixed hyperlipidemia  E78.2 pantoprazole (PROTONIX) 40 MG tablet    6. H/O mechanical aortic valve replacement  Z95.2 pantoprazole (PROTONIX) 40 MG tablet    7. S/P ablation of atrial fibrillation  Z98.890 pantoprazole (PROTONIX) 40 MG tablet   Z86.79        Problem List Items Addressed This Visit       Cardiovascular and Mediastinum   Aortic stenosis   Relevant Medications   pantoprazole (PROTONIX) 40 MG tablet   CHF (congestive heart failure)   Relevant Medications   pantoprazole (PROTONIX) 40 MG tablet     Other   H/O mechanical aortic valve replacement (Chronic)   Relevant Medications   pantoprazole (PROTONIX) 40 MG tablet   Mixed hyperlipidemia   Relevant Medications   pantoprazole (PROTONIX) 40 MG tablet   Cardiac pacemaker in situ   Relevant Medications   pantoprazole (PROTONIX) 40 MG tablet   S/P ablation of atrial fibrillation   Relevant Medications   pantoprazole (PROTONIX) 40 MG tablet   Other Visit Diagnoses     Indigestion    -  Primary   protonix bid for few days then once a day   Relevant Medications   pantoprazole (PROTONIX) 40 MG tablet        Disposition:   Return in about 1 week (around 03/09/2023).    Total time spent: 30 minutes  Signed,  Adrian Blackwater, MD  03/02/2023 11:36 AM    [facility]

## 2023-03-03 ENCOUNTER — Encounter: Payer: Self-pay | Admitting: Cardiovascular Disease

## 2023-03-06 ENCOUNTER — Other Ambulatory Visit: Payer: Self-pay

## 2023-03-06 MED ORDER — AMOXICILLIN 500 MG PO CAPS: 500.0000 mg | ORAL_CAPSULE | ORAL | 0 refills | Status: DC

## 2023-03-11 ENCOUNTER — Other Ambulatory Visit: Payer: Self-pay | Admitting: Nurse Practitioner

## 2023-03-14 ENCOUNTER — Other Ambulatory Visit: Payer: Self-pay | Admitting: Cardiovascular Disease

## 2023-03-15 LAB — PROTIME-INR
INR: 2 — ABNORMAL HIGH (ref 0.9–1.2)
Prothrombin Time: 20.4 s — ABNORMAL HIGH (ref 9.1–12.0)

## 2023-03-29 ENCOUNTER — Other Ambulatory Visit (INDEPENDENT_AMBULATORY_CARE_PROVIDER_SITE_OTHER): Payer: Managed Care, Other (non HMO) | Admitting: Family

## 2023-03-29 DIAGNOSIS — R8290 Unspecified abnormal findings in urine: Secondary | ICD-10-CM | POA: Diagnosis not present

## 2023-03-29 LAB — POCT URINALYSIS DIPSTICK
Bilirubin, UA: NEGATIVE
Blood, UA: POSITIVE
Glucose, UA: NEGATIVE
Ketones, UA: NEGATIVE
Leukocytes, UA: NEGATIVE
Nitrite, UA: NEGATIVE
Protein, UA: NEGATIVE
Spec Grav, UA: 1.015 (ref 1.010–1.025)
Urobilinogen, UA: 0.2 E.U./dL
pH, UA: 6 (ref 5.0–8.0)

## 2023-04-13 ENCOUNTER — Other Ambulatory Visit: Payer: Self-pay | Admitting: Family

## 2023-04-13 MED ORDER — DOXYCYCLINE HYCLATE 100 MG PO CAPS: 100.0000 mg | ORAL_CAPSULE | Freq: Two times a day (BID) | ORAL | 0 refills | Status: DC

## 2023-04-19 ENCOUNTER — Ambulatory Visit
Admission: RE | Admit: 2023-04-19 | Discharge: 2023-04-19 | Disposition: A | Discharge status: Home / Self Care | Payer: Managed Care, Other (non HMO) | Source: Ambulatory Visit | Attending: Nurse Practitioner | Admitting: Nurse Practitioner

## 2023-04-19 DIAGNOSIS — Z1231 Encounter for screening mammogram for malignant neoplasm of breast: Secondary | ICD-10-CM | POA: Insufficient documentation

## 2023-05-04 ENCOUNTER — Other Ambulatory Visit: Payer: Self-pay | Admitting: Cardiovascular Disease

## 2023-05-04 ENCOUNTER — Other Ambulatory Visit: Payer: Self-pay | Admitting: Internal Medicine

## 2023-05-04 DIAGNOSIS — I480 Paroxysmal atrial fibrillation: Secondary | ICD-10-CM

## 2023-05-04 NOTE — Progress Notes (Signed)
, °

## 2023-05-05 LAB — PROTIME-INR
INR: 2 — ABNORMAL HIGH (ref 0.9–1.2)
Prothrombin Time: 20.3 s — ABNORMAL HIGH (ref 9.1–12.0)

## 2023-05-05 LAB — SPECIMEN STATUS REPORT

## 2023-05-15 ENCOUNTER — Other Ambulatory Visit: Payer: Self-pay

## 2023-05-30 ENCOUNTER — Other Ambulatory Visit: Payer: Managed Care, Other (non HMO)

## 2023-05-30 ENCOUNTER — Other Ambulatory Visit: Payer: Self-pay

## 2023-05-30 ENCOUNTER — Other Ambulatory Visit: Payer: Self-pay | Admitting: Cardiovascular Disease

## 2023-05-30 DIAGNOSIS — R7303 Prediabetes: Secondary | ICD-10-CM

## 2023-05-30 DIAGNOSIS — I10 Essential (primary) hypertension: Secondary | ICD-10-CM

## 2023-05-30 DIAGNOSIS — E782 Mixed hyperlipidemia: Secondary | ICD-10-CM

## 2023-05-31 LAB — CMP14+EGFR
ALT: 12 IU/L (ref 0–32)
AST: 19 IU/L (ref 0–40)
Albumin: 4.1 g/dL (ref 3.8–4.8)
Alkaline Phosphatase: 62 IU/L (ref 44–121)
BUN/Creatinine Ratio: 13 (ref 12–28)
BUN: 14 mg/dL (ref 8–27)
Bilirubin Total: 1 mg/dL (ref 0.0–1.2)
CO2: 24 mmol/L (ref 20–29)
Calcium: 8.9 mg/dL (ref 8.7–10.3)
Chloride: 104 mmol/L (ref 96–106)
Creatinine, Ser: 1.04 mg/dL — ABNORMAL HIGH (ref 0.57–1.00)
Globulin, Total: 2.2 g/dL (ref 1.5–4.5)
Glucose: 102 mg/dL — ABNORMAL HIGH (ref 70–99)
Potassium: 4.6 mmol/L (ref 3.5–5.2)
Sodium: 139 mmol/L (ref 134–144)
Total Protein: 6.3 g/dL (ref 6.0–8.5)
eGFR: 56 mL/min/{1.73_m2} — ABNORMAL LOW (ref >59)

## 2023-05-31 LAB — CBC WITH DIFFERENTIAL
Basophils Absolute: 0 10*3/uL (ref 0.0–0.2)
Basos: 1 %
EOS (ABSOLUTE): 0.2 10*3/uL (ref 0.0–0.4)
Eos: 4 %
Hematocrit: 39.1 % (ref 34.0–46.6)
Hemoglobin: 12.6 g/dL (ref 11.1–15.9)
Immature Grans (Abs): 0 10*3/uL (ref 0.0–0.1)
Immature Granulocytes: 0 %
Lymphocytes Absolute: 1.4 10*3/uL (ref 0.7–3.1)
Lymphs: 28 %
MCH: 29.9 pg (ref 26.6–33.0)
MCHC: 32.2 g/dL (ref 31.5–35.7)
MCV: 93 fL (ref 79–97)
Monocytes Absolute: 0.5 10*3/uL (ref 0.1–0.9)
Monocytes: 9 %
Neutrophils Absolute: 2.9 10*3/uL (ref 1.4–7.0)
Neutrophils: 58 %
RBC: 4.21 x10E6/uL (ref 3.77–5.28)
RDW: 12.8 % (ref 11.7–15.4)
WBC: 5 10*3/uL (ref 3.4–10.8)

## 2023-05-31 LAB — LIPID PANEL
Chol/HDL Ratio: 2.8 ratio (ref 0.0–4.4)
Cholesterol, Total: 168 mg/dL (ref 100–199)
HDL: 59 mg/dL (ref >39)
LDL Chol Calc (NIH): 91 mg/dL (ref 0–99)
Triglycerides: 99 mg/dL (ref 0–149)
VLDL Cholesterol Cal: 18 mg/dL (ref 5–40)

## 2023-05-31 LAB — PROTIME-INR
INR: 2 — ABNORMAL HIGH (ref 0.9–1.2)
Prothrombin Time: 21 s — ABNORMAL HIGH (ref 9.1–12.0)

## 2023-05-31 LAB — HEMOGLOBIN A1C
Est. average glucose Bld gHb Est-mCnc: 117 mg/dL
Hgb A1c MFr Bld: 5.7 % — ABNORMAL HIGH (ref 4.8–5.6)

## 2023-05-31 LAB — TSH: TSH: 2.95 u[IU]/mL (ref 0.450–4.500)

## 2023-06-01 NOTE — Progress Notes (Signed)
Patient notified

## 2023-06-06 ENCOUNTER — Other Ambulatory Visit: Payer: Self-pay

## 2023-06-08 ENCOUNTER — Other Ambulatory Visit: Payer: Self-pay | Admitting: Cardiovascular Disease

## 2023-06-09 ENCOUNTER — Other Ambulatory Visit: Payer: Self-pay | Admitting: Cardiovascular Disease

## 2023-07-18 ENCOUNTER — Other Ambulatory Visit: Payer: Self-pay | Admitting: Cardiovascular Disease

## 2023-07-19 LAB — PROTIME-INR
INR: 3.3 — ABNORMAL HIGH (ref 0.9–1.2)
Prothrombin Time: 33.9 s — ABNORMAL HIGH (ref 9.1–12.0)

## 2023-08-08 ENCOUNTER — Other Ambulatory Visit: Payer: Self-pay | Admitting: Cardiovascular Disease

## 2023-08-09 LAB — PROTIME-INR
INR: 2.9 — ABNORMAL HIGH (ref 0.9–1.2)
Prothrombin Time: 29.7 s — ABNORMAL HIGH (ref 9.1–12.0)

## 2023-08-10 ENCOUNTER — Other Ambulatory Visit: Payer: Self-pay

## 2023-08-10 MED ORDER — WARFARIN SODIUM 6 MG PO TABS: ORAL_TABLET | ORAL | 1 refills | Status: DC

## 2023-08-18 ENCOUNTER — Other Ambulatory Visit: Payer: Self-pay

## 2023-08-18 MED ORDER — AMOXICILLIN 500 MG PO CAPS: ORAL_CAPSULE | ORAL | 0 refills | Status: DC

## 2023-09-06 ENCOUNTER — Encounter: Payer: Self-pay | Admitting: Cardiology

## 2023-09-06 ENCOUNTER — Ambulatory Visit (INDEPENDENT_AMBULATORY_CARE_PROVIDER_SITE_OTHER): Payer: Managed Care, Other (non HMO) | Admitting: Cardiology

## 2023-09-06 VITALS — BP 106/68 | HR 70 | Ht 65.5 in | Wt 146.5 lb

## 2023-09-06 DIAGNOSIS — I50814 Right heart failure due to left heart failure: Secondary | ICD-10-CM

## 2023-09-06 DIAGNOSIS — I350 Nonrheumatic aortic (valve) stenosis: Secondary | ICD-10-CM | POA: Diagnosis not present

## 2023-09-06 DIAGNOSIS — I480 Paroxysmal atrial fibrillation: Secondary | ICD-10-CM | POA: Diagnosis not present

## 2023-09-06 DIAGNOSIS — Z0130 Encounter for examination of blood pressure without abnormal findings: Secondary | ICD-10-CM

## 2023-09-06 NOTE — Assessment & Plan Note (Addendum)
Most recent echo 04/2021. Will repeat echo to check mechanical valve function. Normal stress test 10/2021. Will repeat to check for blockages.

## 2023-09-06 NOTE — Progress Notes (Signed)
Cardiology Office Note   Date:  09/06/2023   ID:  Aleya, Maloof [birth year], MRN [id number]  PCP:  Orson Eva, NP  Cardiologist:  Marisue Ivan, NP      History of Present Illness: Kristin Ferrell is a 77 y.o. female who presents for  Chief Complaint  Patient presents with   Follow-up    Cardio follow up    Patient in office for routine cardiac exam. Denies chest pain, shortness of breath, dizziness, lower extremity edema. Patient reports episode of a "thump" in her chest, reports it felt like her valve malfunctioning. Patient states she felt poorly the remainder of the day.       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     furosemide (LASIX) 20 MG tablet TAKE 1 TABLET BY MOUTH EVERY DAY 90 tablet 1   linaclotide (LINZESS) 72 MCG capsule Take by mouth.     lisinopril (ZESTRIL) 5 MG tablet TAKE 1 TABLET BY MOUTH DAILY 90 tablet 1   metoprolol succinate (TOPROL-XL) 100 MG 24 hr tablet Take 1 tablet (100 mg total) by mouth daily. (Patient taking differently: Take 100 mg by mouth daily. Take 1/2 tablet.) 90 tablet 3   pantoprazole (PROTONIX) 40 MG tablet Take 1 tablet (40 mg total) by mouth daily. 30 tablet 1   warfarin  (COUMADIN) 6 MG tablet Take one tablet by mouth once daily 90 tablet 1   amoxicillin (AMOXIL) 500 MG capsule Take 4 capsules by mouth 1 hour before dental procedure (Patient not taking: Reported on 09/06/2023) 4 capsule 0   fluticasone (FLONASE) 50 MCG/ACT nasal spray Place 1 spray into both nostrils daily. (Patient not taking: Reported on 09/06/2023)     simvastatin (ZOCOR) 20 MG tablet TAKE 1 TABLET BY MOUTH EVERY NIGHT AT BEDTIME FOR CHOLESTEROL (Patient not taking: Reported on 09/06/2023) 90 tablet 3   tiZANidine (ZANAFLEX) 4 MG tablet Take 1 tablet (4 mg total) by mouth daily. (Patient not taking: Reported on 03/02/2023) 30 tablet 1   No current facility-administered medications for this visit.    Allergies:   Hydralazine    Social History:   reports that she has never smoked. She has never used smokeless tobacco. She reports that she does not drink alcohol and does not use drugs.   Family History:  family history is not on file.    ROS:     Review of Systems  Constitutional: Negative.   HENT: Negative.    Eyes: Negative.   Respiratory: Negative.    Cardiovascular: Negative.   Gastrointestinal: Negative.   Genitourinary: Negative.   Musculoskeletal: Negative.  Skin: Negative.   Neurological: Negative.   Endo/Heme/Allergies: Negative.   Psychiatric/Behavioral: Negative.    All other systems reviewed and are negative.     All other systems are reviewed and negative.    PHYSICAL EXAM: VS:  BP 106/68   Pulse 70   Ht 5' 5.5" (1.664 m)   Wt 146 lb 8 oz (66.5 kg)   SpO2 99%   BMI 24.01 kg/m  , BMI Body mass index is 24.01 kg/m. Last weight:  Wt Readings from Last 3 Encounters:  09/06/23 146 lb 8 oz (66.5 kg)  03/02/23 148 lb (67.1 kg)  01/31/23 149 lb (67.6 kg)     Physical Exam Vitals and nursing note reviewed.  Constitutional:      Appearance: Normal appearance. She is normal weight.  HENT:     Head: Normocephalic and atraumatic.     Nose: Nose normal.      Mouth/Throat:     Mouth: Mucous membranes are moist.     Pharynx: Oropharynx is clear.  Eyes:     Conjunctiva/sclera: Conjunctivae normal.     Pupils: Pupils are equal, round, and reactive to light.  Cardiovascular:     Rate and Rhythm: Normal rate and regular rhythm.     Pulses: Normal pulses.     Heart sounds: Normal heart sounds.  Pulmonary:     Effort: Pulmonary effort is normal.     Breath sounds: Normal breath sounds.  Abdominal:     General: Abdomen is flat. Bowel sounds are normal.     Palpations: Abdomen is soft.  Musculoskeletal:        General: Normal range of motion.     Cervical back: Normal range of motion.  Skin:    General: Skin is warm and dry.  Neurological:     General: No focal deficit present.     Mental Status: She is alert and oriented to person, place, and time. Mental status is at baseline.  Psychiatric:        Mood and Affect: Mood normal.        Behavior: Behavior normal.      EKG: none today  Recent Labs: 01/30/2023: Platelets 183 05/30/2023: ALT 12; BUN 14; Creatinine, Ser 1.04; Hemoglobin 12.6; Potassium 4.6; Sodium 139; TSH 2.950    Lipid Panel    Component Value Date/Time   CHOL 168 05/30/2023 1149   TRIG 99 05/30/2023 1149   HDL 59 05/30/2023 1149   CHOLHDL 2.8 05/30/2023 1149   LDLCALC 91 05/30/2023 1149       ASSESSMENT AND PLAN:    ICD-10-CM   1. Paroxysmal atrial fibrillation (HCC)  I48.0 MYOCARDIAL PERFUSION IMAGING    CANCELED: MYOCARDIAL PERFUSION IMAGING    2. Nonrheumatic aortic valve stenosis  I35.0 MYOCARDIAL PERFUSION IMAGING    PCV ECHOCARDIOGRAM COMPLETE    CANCELED: PCV ECHOCARDIOGRAM COMPLETE    CANCELED: MYOCARDIAL PERFUSION IMAGING    3. 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       Relevant Orders   MYOCARDIAL PERFUSION IMAGING   Aortic stenosis    Most recent echo 04/2021. Will repeat echo to check mechanical valve function. Normal stress test 10/2021. Will  repeat to check for blockages.       Relevant Orders   MYOCARDIAL PERFUSION IMAGING   PCV ECHOCARDIOGRAM COMPLETE   CHF (congestive heart failure) (HCC)   Relevant Orders   MYOCARDIAL PERFUSION IMAGING     Disposition:   Return in about 4 weeks (around 10/04/2023) for after testing.    Total time spent: 25 minutes  Signed,  Marisue Ivan, NP  09/06/2023 12:26 PM    [facility]

## 2023-09-06 NOTE — Assessment & Plan Note (Signed)
In sinus rhythm on auscultation.

## 2023-09-25 ENCOUNTER — Other Ambulatory Visit: Payer: Self-pay | Admitting: Cardiovascular Disease

## 2023-09-26 ENCOUNTER — Ambulatory Visit (INDEPENDENT_AMBULATORY_CARE_PROVIDER_SITE_OTHER): Payer: Managed Care, Other (non HMO) | Admitting: Family

## 2023-09-26 DIAGNOSIS — R300 Dysuria: Secondary | ICD-10-CM

## 2023-09-26 DIAGNOSIS — R109 Unspecified abdominal pain: Secondary | ICD-10-CM | POA: Diagnosis not present

## 2023-09-26 LAB — POCT URINALYSIS DIPSTICK
Bilirubin, UA: NEGATIVE
Glucose, UA: NEGATIVE
Ketones, UA: NEGATIVE
Nitrite, UA: NEGATIVE
Protein, UA: NEGATIVE
Spec Grav, UA: <=1.005 — AB (ref 1.010–1.025)
Urobilinogen, UA: 0.2 U/dL
pH, UA: 5.5 (ref 5.0–8.0)

## 2023-09-26 NOTE — Progress Notes (Signed)
   CHIEF COMPLAINT  UA/ only visit fot UTI     REASON FOR VISIT  Possible UTI, UA Visit Only      ASSESSMENT & PLAN Diagnoses and all orders for this visit:  Flank pain -     Urinalysis, Routine w reflex microscopic -     Urine Culture     Patient notified.  Total time spent: 5 minutes  Miki Kins, FNP 09/26/2023

## 2023-09-27 ENCOUNTER — Other Ambulatory Visit: Payer: Self-pay

## 2023-09-27 ENCOUNTER — Other Ambulatory Visit: Payer: Self-pay | Admitting: Family

## 2023-09-27 LAB — URINALYSIS, ROUTINE W REFLEX MICROSCOPIC
Bilirubin, UA: NEGATIVE
Glucose, UA: NEGATIVE
Ketones, UA: NEGATIVE
Nitrite, UA: NEGATIVE
Protein,UA: NEGATIVE
RBC, UA: NEGATIVE
Specific Gravity, UA: 1.009 (ref 1.005–1.030)
Urobilinogen, Ur: 0.2 mg/dL (ref 0.2–1.0)
pH, UA: 5.5 (ref 5.0–7.5)

## 2023-09-27 LAB — MICROSCOPIC EXAMINATION
Bacteria, UA: NONE SEEN
Casts: NONE SEEN /[LPF]
RBC, Urine: NONE SEEN /[HPF] (ref 0–2)

## 2023-09-27 MED ORDER — CIPROFLOXACIN HCL 500 MG PO TABS: 500.0000 mg | ORAL_TABLET | Freq: Every day | ORAL | 0 refills | Status: AC

## 2023-09-28 LAB — URINE CULTURE

## 2023-09-29 ENCOUNTER — Other Ambulatory Visit: Payer: Self-pay

## 2023-09-29 ENCOUNTER — Other Ambulatory Visit: Payer: Self-pay | Admitting: Cardiovascular Disease

## 2023-09-29 DIAGNOSIS — I10 Essential (primary) hypertension: Secondary | ICD-10-CM

## 2023-09-29 DIAGNOSIS — E782 Mixed hyperlipidemia: Secondary | ICD-10-CM

## 2023-09-29 DIAGNOSIS — R7303 Prediabetes: Secondary | ICD-10-CM

## 2023-09-30 LAB — CBC WITH DIFFERENTIAL/PLATELET
Basophils Absolute: 0 10*3/uL (ref 0.0–0.2)
Basos: 0 %
EOS (ABSOLUTE): 0.1 10*3/uL (ref 0.0–0.4)
Eos: 2 %
Hematocrit: 39.5 % (ref 34.0–46.6)
Hemoglobin: 13.2 g/dL (ref 11.1–15.9)
Immature Grans (Abs): 0 10*3/uL (ref 0.0–0.1)
Immature Granulocytes: 0 %
Lymphocytes Absolute: 1.3 10*3/uL (ref 0.7–3.1)
Lymphs: 27 %
MCH: 30.7 pg (ref 26.6–33.0)
MCHC: 33.4 g/dL (ref 31.5–35.7)
MCV: 92 fL (ref 79–97)
Monocytes Absolute: 0.5 10*3/uL (ref 0.1–0.9)
Monocytes: 11 %
Neutrophils Absolute: 3 10*3/uL (ref 1.4–7.0)
Neutrophils: 60 %
Platelets: 175 10*3/uL (ref 150–450)
RBC: 4.3 x10E6/uL (ref 3.77–5.28)
RDW: 12.5 % (ref 11.7–15.4)
WBC: 5 10*3/uL (ref 3.4–10.8)

## 2023-09-30 LAB — CMP14+EGFR
ALT: 10 [IU]/L (ref 0–32)
AST: 23 [IU]/L (ref 0–40)
Albumin: 4.2 g/dL (ref 3.8–4.8)
Alkaline Phosphatase: 65 [IU]/L (ref 44–121)
BUN/Creatinine Ratio: 13 (ref 12–28)
BUN: 13 mg/dL (ref 8–27)
Bilirubin Total: 1.4 mg/dL — ABNORMAL HIGH (ref 0.0–1.2)
CO2: 24 mmol/L (ref 20–29)
Calcium: 9.5 mg/dL (ref 8.7–10.3)
Chloride: 106 mmol/L (ref 96–106)
Creatinine, Ser: 1.04 mg/dL — ABNORMAL HIGH (ref 0.57–1.00)
Globulin, Total: 2.3 g/dL (ref 1.5–4.5)
Glucose: 104 mg/dL — ABNORMAL HIGH (ref 70–99)
Potassium: 4.8 mmol/L (ref 3.5–5.2)
Sodium: 143 mmol/L (ref 134–144)
Total Protein: 6.5 g/dL (ref 6.0–8.5)
eGFR: 56 mL/min/{1.73_m2} — ABNORMAL LOW (ref >59)

## 2023-09-30 LAB — LIPID PANEL
Chol/HDL Ratio: 2.6 ratio (ref 0.0–4.4)
Cholesterol, Total: 172 mg/dL (ref 100–199)
HDL: 66 mg/dL (ref >39)
LDL Chol Calc (NIH): 92 mg/dL (ref 0–99)
Triglycerides: 73 mg/dL (ref 0–149)
VLDL Cholesterol Cal: 14 mg/dL (ref 5–40)

## 2023-09-30 LAB — HEMOGLOBIN A1C
Est. average glucose Bld gHb Est-mCnc: 117 mg/dL
Hgb A1c MFr Bld: 5.7 % — ABNORMAL HIGH (ref 4.8–5.6)

## 2023-09-30 LAB — PROTIME-INR
INR: 1.9 — ABNORMAL HIGH (ref 0.9–1.2)
Prothrombin Time: 20.3 s — ABNORMAL HIGH (ref 9.1–12.0)

## 2023-10-04 ENCOUNTER — Encounter: Payer: Self-pay | Admitting: Family

## 2023-10-04 ENCOUNTER — Ambulatory Visit (INDEPENDENT_AMBULATORY_CARE_PROVIDER_SITE_OTHER): Payer: Managed Care, Other (non HMO)

## 2023-10-04 ENCOUNTER — Ambulatory Visit (INDEPENDENT_AMBULATORY_CARE_PROVIDER_SITE_OTHER): Payer: Managed Care, Other (non HMO) | Admitting: Family

## 2023-10-04 VITALS — BP 118/68 | HR 70 | Ht 65.5 in | Wt 148.0 lb

## 2023-10-04 DIAGNOSIS — E782 Mixed hyperlipidemia: Secondary | ICD-10-CM

## 2023-10-04 DIAGNOSIS — I340 Nonrheumatic mitral (valve) insufficiency: Secondary | ICD-10-CM | POA: Diagnosis not present

## 2023-10-04 DIAGNOSIS — I50814 Right heart failure due to left heart failure: Secondary | ICD-10-CM

## 2023-10-04 DIAGNOSIS — I10 Essential (primary) hypertension: Secondary | ICD-10-CM | POA: Diagnosis not present

## 2023-10-04 DIAGNOSIS — I350 Nonrheumatic aortic (valve) stenosis: Secondary | ICD-10-CM

## 2023-10-04 DIAGNOSIS — R109 Unspecified abdominal pain: Secondary | ICD-10-CM

## 2023-10-04 DIAGNOSIS — E0590 Thyrotoxicosis, unspecified without thyrotoxic crisis or storm: Secondary | ICD-10-CM

## 2023-10-04 DIAGNOSIS — I361 Nonrheumatic tricuspid (valve) insufficiency: Secondary | ICD-10-CM | POA: Diagnosis not present

## 2023-10-04 DIAGNOSIS — N1831 Chronic kidney disease, stage 3a: Secondary | ICD-10-CM

## 2023-10-04 DIAGNOSIS — I351 Nonrheumatic aortic (valve) insufficiency: Secondary | ICD-10-CM

## 2023-10-04 DIAGNOSIS — R7303 Prediabetes: Secondary | ICD-10-CM | POA: Diagnosis not present

## 2023-10-04 DIAGNOSIS — I480 Paroxysmal atrial fibrillation: Secondary | ICD-10-CM

## 2023-10-05 LAB — URINALYSIS, ROUTINE W REFLEX MICROSCOPIC
Bilirubin, UA: NEGATIVE
Glucose, UA: NEGATIVE
Ketones, UA: NEGATIVE
Nitrite, UA: NEGATIVE
Protein,UA: NEGATIVE
RBC, UA: NEGATIVE
Specific Gravity, UA: 1.008 (ref 1.005–1.030)
Urobilinogen, Ur: 0.2 mg/dL (ref 0.2–1.0)
pH, UA: 5.5 (ref 5.0–7.5)

## 2023-10-05 LAB — MICROSCOPIC EXAMINATION
Bacteria, UA: NONE SEEN
Casts: NONE SEEN /[LPF]
RBC, Urine: NONE SEEN /[HPF] (ref 0–2)

## 2023-10-06 LAB — URINE CULTURE: Organism ID, Bacteria: NO GROWTH

## 2023-10-08 ENCOUNTER — Encounter: Payer: Self-pay | Admitting: Family

## 2023-10-08 NOTE — Assessment & Plan Note (Signed)
Patient stable.  Well controlled with current therapy.   Continue current meds.  

## 2023-10-08 NOTE — Progress Notes (Signed)
Established Patient Office Visit  Subjective:  Patient ID: Kristin Ferrell, female    DOB: [birth year]  Age: 77 y.o. MRN: [id number]  Chief Complaint  Patient presents with   Follow-up    Follow up    Patient is here today for her 4 months follow up.  She has been feeling fairly well since last appointment.   She does have additional concerns to discuss today.  She has continued to have low back pain that has been bothering her, despite the normal urinalysis and culture that we did last week.   Labs were done recently, so we will review in detail today. She needs refills.   I have reviewed her active problem list, medication list, allergies, health maintenance, notes from last encounter, lab results for her appointment today.      No other concerns at this time.   Past Medical History:  Diagnosis Date   Anxiety    Aortic stenosis    Arthritis    CHF (congestive heart failure) (HCC)    Dysrhythmia    Hyperlipidemia    Hypertension    Hypothyroidism    Mitral regurgitation    Tricuspid regurgitation    Vitamin D deficiency     Past Surgical History:  Procedure Laterality Date   BREAST BIOPSY  90s   stereo bx    CARDIAC ELECTROPHYSIOLOGY STUDY AND ABLATION     COLONOSCOPY WITH PROPOFOL N/A 03/05/2018   Procedure: COLONOSCOPY WITH PROPOFOL;  Surgeon: Scot Jun, MD;  Location: [facility] ENDOSCOPY;  Service: Endoscopy;  Laterality: N/A;   MECHANICAL AORTIC VALVE REPLACEMENT  2007   pace maker  2015    Social History   Socioeconomic History   Marital status: Married    Spouse name: Not on file   Number of children: Not on file   Years of education: Not on file   Highest education level: Not on file  Occupational History   Not on file  Tobacco Use   Smoking status: Never   Smokeless tobacco: Never  Vaping Use   Vaping status: Never Used  Substance and Sexual Activity   Alcohol use: No   Drug use: No   Sexual activity: Never    Partners: Male     Birth control/protection: None  Other Topics Concern   Not on file  Social History Narrative   Not on file   Social Determinants of Health   Financial Resource Strain: Not on file  Food Insecurity: Not on file  Transportation Needs: Not on file  Physical Activity: Inactive (09/11/2019)   Exercise Vital Sign    Days of Exercise per Week: 0 days    Minutes of Exercise per Session: 0 min  Stress: No Stress Concern Present (09/11/2019)   [employer] of Occupational Health - Occupational Stress Questionnaire    Feeling of Stress : Only a little  Social Connections: Socially Integrated (09/11/2019)   Social Connection and Isolation Panel [NHANES]    Frequency of Communication with Friends and Family: More than three times a week    Frequency of Social Gatherings with Friends and Family: Twice a week    Attends Religious Services: More than 4 times per year    Active Member of [employer] or Organizations: Yes    Attends Banker Meetings: More than 4 times per year    Marital Status: Married  Catering manager Violence: Not At Risk (09/11/2019)   Humiliation, Afraid, Rape, and Kick questionnaire    Fear of  Current or Ex-Partner: No    Emotionally Abused: No    Physically Abused: No    Sexually Abused: No    Family History  Problem Relation Age of Onset   Breast cancer Neg Hx     Allergies  Allergen Reactions   Hydralazine Other (See Comments) and Shortness Of Breath    hyptoension    Review of Systems  Genitourinary:  Positive for flank pain.  All other systems reviewed and are negative.      Objective:   BP 118/68   Pulse 70   Ht 5' 5.5" (1.664 m)   Wt 148 lb (67.1 kg)   SpO2 97%   BMI 24.25 kg/m   Vitals:   10/04/23 1351  BP: 118/68  Pulse: 70  Height: 5' 5.5" (1.664 m)  Weight: 148 lb (67.1 kg)  SpO2: 97%  BMI (Calculated): 24.25    Physical Exam Vitals and nursing note reviewed.  Constitutional:      Appearance: Normal appearance. She is  normal weight.  HENT:     Head: Normocephalic.  Eyes:     Extraocular Movements: Extraocular movements intact.     Conjunctiva/sclera: Conjunctivae normal.     Pupils: Pupils are equal, round, and reactive to light.  Cardiovascular:     Rate and Rhythm: Normal rate.  Pulmonary:     Effort: Pulmonary effort is normal.  Neurological:     General: No focal deficit present.     Mental Status: She is alert and oriented to person, place, and time. Mental status is at baseline.  Psychiatric:        Mood and Affect: Mood normal.        Behavior: Behavior normal.        Thought Content: Thought content normal.        Judgment: Judgment normal.      Results for orders placed or performed in visit on 10/04/23  Urine Culture   Specimen: Urine, Clean Catch   UC  Result Value Ref Range   Urine Culture, Routine Final report    Organism ID, Bacteria No growth   Microscopic Examination  Result Value Ref Range   WBC, UA 0-5 0 - 5 /hpf   RBC, Urine None seen 0 - 2 /hpf   Epithelial Cells (non renal) 0-10 0 - 10 /hpf   Casts None seen None seen /lpf   Bacteria, UA None seen None seen/Few  Urinalysis, Routine w reflex microscopic  Result Value Ref Range   Specific Gravity, UA 1.008 1.005 - 1.030   pH, UA 5.5 5.0 - 7.5   Color, UA Yellow Yellow   Appearance Ur Clear Clear   Leukocytes,UA Trace (A) Negative   Protein,UA Negative Negative/Trace   Glucose, UA Negative Negative   Ketones, UA Negative Negative   RBC, UA Negative Negative   Bilirubin, UA Negative Negative   Urobilinogen, Ur 0.2 0.2 - 1.0 mg/dL   Nitrite, UA Negative Negative   Microscopic Examination See below:     Recent Results (from the past 2160 hour(s))  Protime-INR     Status: Abnormal   Collection Time: 07/18/23 12:19 PM  Result Value Ref Range   INR 3.3 (H) 0.9 - 1.2    Comment: Reference interval is for non-anticoagulated patients. Suggested INR therapeutic range for Vitamin K antagonist therapy:     Standard Dose (moderate intensity                   therapeutic range):  2.0 - 3.0    Higher intensity therapeutic range       2.5 - 3.5    Prothrombin Time 33.9 (H) 9.1 - 12.0 sec  Protime-INR     Status: Abnormal   Collection Time: 08/08/23  9:04 AM  Result Value Ref Range   INR 2.9 (H) 0.9 - 1.2    Comment: Reference interval is for non-anticoagulated patients. Suggested INR therapeutic range for Vitamin K antagonist therapy:    Standard Dose (moderate intensity                   therapeutic range):       2.0 - 3.0    Higher intensity therapeutic range       2.5 - 3.5    Prothrombin Time 29.7 (H) 9.1 - 12.0 sec  Urinalysis, Routine w reflex microscopic     Status: Abnormal   Collection Time: 09/26/23  4:25 PM  Result Value Ref Range   Specific Gravity, UA 1.009 1.005 - 1.030   pH, UA 5.5 5.0 - 7.5   Color, UA Yellow Yellow   Appearance Ur Clear Clear   Leukocytes,UA 2+ (A) Negative   Protein,UA Negative Negative/Trace   Glucose, UA Negative Negative   Ketones, UA Negative Negative   RBC, UA Negative Negative   Bilirubin, UA Negative Negative   Urobilinogen, Ur 0.2 0.2 - 1.0 mg/dL   Nitrite, UA Negative Negative   Microscopic Examination See below:     Comment: Microscopic was indicated and was performed.  Microscopic Examination     Status: Abnormal   Collection Time: 09/26/23  4:25 PM  Result Value Ref Range   WBC, UA 11-30 (A) 0 - 5 /hpf   RBC, Urine None seen 0 - 2 /hpf   Epithelial Cells (non renal) 0-10 0 - 10 /hpf   Casts None seen None seen /lpf   Bacteria, UA None seen None seen/Few  Urine Culture     Status: None   Collection Time: 09/26/23  4:28 PM   Specimen: Urine, Clean Catch   UC  Result Value Ref Range   Urine Culture, Routine Final report    Organism ID, Bacteria Comment     Comment: Culture shows less than 10,000 colony forming units of bacteria per milliliter of urine. This colony count is not generally considered to be clinically  significant.   POCT Urinalysis Dipstick (09811)     Status: Abnormal   Collection Time: 09/26/23  4:32 PM  Result Value Ref Range   Color, UA yellow    Clarity, UA cloudy    Glucose, UA Negative Negative   Bilirubin, UA negative    Ketones, UA negative    Spec Grav, UA <=1.005 (A) 1.010 - 1.025   Blood, UA trace    pH, UA 5.5 5.0 - 8.0   Protein, UA Negative Negative   Urobilinogen, UA 0.2 0.2 or 1.0 E.U./dL   Nitrite, UA negative    Leukocytes, UA Small (1+) (A) Negative   Appearance yellow    Odor none   Protime-INR     Status: Abnormal   Collection Time: 09/29/23 11:25 AM  Result Value Ref Range   INR 1.9 (H) 0.9 - 1.2    Comment: Reference interval is for non-anticoagulated patients. Suggested INR therapeutic range for Vitamin K antagonist therapy:    Standard Dose (moderate intensity                   therapeutic range):  2.0 - 3.0    Higher intensity therapeutic range       2.5 - 3.5    Prothrombin Time 20.3 (H) 9.1 - 12.0 sec  CBC with Differential/Platelet     Status: None   Collection Time: 09/29/23  1:45 PM  Result Value Ref Range   WBC 5.0 3.4 - 10.8 x10E3/uL    Comment: **Effective October 09, 2023 profile 005025 WBC will be made**   non-orderable as a stand-alone order code.    RBC 4.30 3.77 - 5.28 x10E6/uL   Hemoglobin 13.2 11.1 - 15.9 g/dL   Hematocrit 95.2 84.1 - 46.6 %   MCV 92 79 - 97 fL   MCH 30.7 26.6 - 33.0 pg   MCHC 33.4 31.5 - 35.7 g/dL   RDW 32.4 40.1 - 02.7 %   Platelets 175 150 - 450 x10E3/uL   Neutrophils 60 Not Estab. %   Lymphs 27 Not Estab. %   Monocytes 11 Not Estab. %   Eos 2 Not Estab. %   Basos 0 Not Estab. %   Neutrophils Absolute 3.0 1.4 - 7.0 x10E3/uL   Lymphocytes Absolute 1.3 0.7 - 3.1 x10E3/uL   Monocytes Absolute 0.5 0.1 - 0.9 x10E3/uL   EOS (ABSOLUTE) 0.1 0.0 - 0.4 x10E3/uL   Basophils Absolute 0.0 0.0 - 0.2 x10E3/uL   Immature Granulocytes 0 Not Estab. %   Immature Grans (Abs) 0.0 0.0 - 0.1 x10E3/uL  CMP14+EGFR      Status: Abnormal   Collection Time: 09/29/23  1:45 PM  Result Value Ref Range   Glucose 104 (H) 70 - 99 mg/dL   BUN 13 8 - 27 mg/dL   Creatinine, Ser 2.53 (H) 0.57 - 1.00 mg/dL   eGFR 56 (L) >66 YQ/IHK/7.42   BUN/Creatinine Ratio 13 12 - 28   Sodium 143 134 - 144 mmol/L   Potassium 4.8 3.5 - 5.2 mmol/L   Chloride 106 96 - 106 mmol/L   CO2 24 20 - 29 mmol/L   Calcium 9.5 8.7 - 10.3 mg/dL   Total Protein 6.5 6.0 - 8.5 g/dL   Albumin 4.2 3.8 - 4.8 g/dL   Globulin, Total 2.3 1.5 - 4.5 g/dL   Bilirubin Total 1.4 (H) 0.0 - 1.2 mg/dL   Alkaline Phosphatase 65 44 - 121 IU/L   AST 23 0 - 40 IU/L   ALT 10 0 - 32 IU/L  Lipid panel     Status: None   Collection Time: 09/29/23  1:45 PM  Result Value Ref Range   Cholesterol, Total 172 100 - 199 mg/dL   Triglycerides 73 0 - 149 mg/dL   HDL 66 >59 mg/dL   VLDL Cholesterol Cal 14 5 - 40 mg/dL   LDL Chol Calc (NIH) 92 0 - 99 mg/dL   Chol/HDL Ratio 2.6 0.0 - 4.4 ratio    Comment:                                   T. Chol/HDL Ratio                                             Men  Women  1/2 Avg.Risk  3.4    3.3                                   Avg.Risk  5.0    4.4                                2X Avg.Risk  9.6    7.1                                3X Avg.Risk 23.4   11.0   Hemoglobin A1c     Status: Abnormal   Collection Time: 09/29/23  1:45 PM  Result Value Ref Range   Hgb A1c MFr Bld 5.7 (H) 4.8 - 5.6 %    Comment:          Prediabetes: 5.7 - 6.4          Diabetes: >6.4          Glycemic control for adults with diabetes: <7.0    Est. average glucose Bld gHb Est-mCnc 117 mg/dL  Urine Culture     Status: None   Collection Time: 10/04/23  2:37 PM   Specimen: Urine, Clean Catch   UC  Result Value Ref Range   Urine Culture, Routine Final report    Organism ID, Bacteria No growth   Urinalysis, Routine w reflex microscopic     Status: Abnormal   Collection Time: 10/04/23  2:38 PM  Result Value Ref Range    Specific Gravity, UA 1.008 1.005 - 1.030   pH, UA 5.5 5.0 - 7.5   Color, UA Yellow Yellow   Appearance Ur Clear Clear   Leukocytes,UA Trace (A) Negative   Protein,UA Negative Negative/Trace   Glucose, UA Negative Negative   Ketones, UA Negative Negative   RBC, UA Negative Negative   Bilirubin, UA Negative Negative   Urobilinogen, Ur 0.2 0.2 - 1.0 mg/dL   Nitrite, UA Negative Negative   Microscopic Examination See below:     Comment: Microscopic was indicated and was performed.  Microscopic Examination     Status: None   Collection Time: 10/04/23  2:38 PM  Result Value Ref Range   WBC, UA 0-5 0 - 5 /hpf   RBC, Urine None seen 0 - 2 /hpf   Epithelial Cells (non renal) 0-10 0 - 10 /hpf   Casts None seen None seen /lpf   Bacteria, UA None seen None seen/Few       Assessment & Plan:   Problem List Items Addressed This Visit       Active Problems   Prediabetes   Mixed hyperlipidemia   Paroxysmal atrial fibrillation (HCC) (Chronic)    Patient is seen by Cardiology, who manage this condition.  She is well controlled with current therapy.   Will defer to them for further changes to plan of care.       CHF (congestive heart failure) (HCC)    Patient is seen by Cardiology, who manage this condition.  She is well controlled with current therapy.  She did have an echo today to evaluate.  Will defer to them for further changes to plan of care.       CKD (chronic kidney disease) stage 3, GFR 30-59 ml/min (HCC)    Patient stable.  Well controlled  with current therapy.   Continue current meds.        Hyperthyroidism (Chronic)    Patient stable.  Well controlled with current therapy.   Continue current meds.        Other Visit Diagnoses     Flank pain    -  Primary   will get repeat UA/UC today in office.  I will let pt. know once we have results.   Relevant Orders   Urinalysis, Routine w reflex microscopic (Completed)   Urine Culture (Completed)    Hypertension, essential       Blood pressure well controlled with current medications.  Continue current therapy.  Will reassess at follow up.       Return in about 3 months (around 01/04/2024).   Total time spent: 20 minutes  Miki Kins, FNP  10/04/2023   This document may have been prepared by [facility] Voice Recognition software and as such may include unintentional dictation errors.

## 2023-10-08 NOTE — Assessment & Plan Note (Signed)
Patient is seen by Cardiology, who manage this condition.  She is well controlled with current therapy.   Will defer to them for further changes to plan of care.

## 2023-10-08 NOTE — Assessment & Plan Note (Signed)
Patient is seen by Cardiology, who manage this condition.  She is well controlled with current therapy.  She did have an echo today to evaluate.  Will defer to them for further changes to plan of care.

## 2023-10-23 ENCOUNTER — Other Ambulatory Visit: Payer: Self-pay | Admitting: Cardiovascular Disease

## 2023-10-24 LAB — PROTIME-INR
INR: 2.4 — ABNORMAL HIGH (ref 0.9–1.2)
Prothrombin Time: 25.1 s — ABNORMAL HIGH (ref 9.1–12.0)

## 2023-10-25 ENCOUNTER — Ambulatory Visit (INDEPENDENT_AMBULATORY_CARE_PROVIDER_SITE_OTHER): Payer: Managed Care, Other (non HMO)

## 2023-10-25 DIAGNOSIS — I50814 Right heart failure due to left heart failure: Secondary | ICD-10-CM

## 2023-10-25 DIAGNOSIS — I480 Paroxysmal atrial fibrillation: Secondary | ICD-10-CM

## 2023-10-25 DIAGNOSIS — I350 Nonrheumatic aortic (valve) stenosis: Secondary | ICD-10-CM

## 2023-10-25 MED ORDER — TECHNETIUM TC 99M SESTAMIBI GENERIC - CARDIOLITE
32.8000 | Freq: Once | INTRAVENOUS | Status: AC | PRN
  Administered 2023-10-25: 32.8 via INTRAVENOUS

## 2023-10-25 MED ORDER — TECHNETIUM TC 99M SESTAMIBI GENERIC - CARDIOLITE
10.5000 | Freq: Once | INTRAVENOUS | Status: AC | PRN
  Administered 2023-10-25: 10.5 via INTRAVENOUS

## 2023-10-27 ENCOUNTER — Other Ambulatory Visit: Payer: Managed Care, Other (non HMO)

## 2023-10-30 ENCOUNTER — Other Ambulatory Visit: Payer: Self-pay | Admitting: Cardiology

## 2023-10-30 DIAGNOSIS — R079 Chest pain, unspecified: Secondary | ICD-10-CM

## 2023-11-02 ENCOUNTER — Ambulatory Visit (INDEPENDENT_AMBULATORY_CARE_PROVIDER_SITE_OTHER): Payer: Managed Care, Other (non HMO) | Admitting: Internal Medicine

## 2023-11-02 DIAGNOSIS — R109 Unspecified abdominal pain: Secondary | ICD-10-CM | POA: Diagnosis not present

## 2023-11-02 DIAGNOSIS — N390 Urinary tract infection, site not specified: Secondary | ICD-10-CM

## 2023-11-02 DIAGNOSIS — R300 Dysuria: Secondary | ICD-10-CM

## 2023-11-02 LAB — POCT URINALYSIS DIPSTICK
Bilirubin, UA: NEGATIVE
Blood, UA: NEGATIVE
Glucose, UA: NEGATIVE
Ketones, UA: NEGATIVE
Nitrite, UA: NEGATIVE
Protein, UA: NEGATIVE
Spec Grav, UA: 1.01 (ref 1.010–1.025)
Urobilinogen, UA: NEGATIVE U/dL — AB
pH, UA: 6 (ref 5.0–8.0)

## 2023-11-03 ENCOUNTER — Telehealth: Payer: Self-pay | Admitting: Cardiovascular Disease

## 2023-11-03 ENCOUNTER — Other Ambulatory Visit: Payer: Self-pay

## 2023-11-03 ENCOUNTER — Telehealth: Payer: Self-pay | Admitting: Family

## 2023-11-03 ENCOUNTER — Other Ambulatory Visit: Payer: Self-pay | Admitting: Family

## 2023-11-03 ENCOUNTER — Ambulatory Visit: Payer: Managed Care, Other (non HMO)

## 2023-11-03 ENCOUNTER — Encounter: Payer: Self-pay | Admitting: Internal Medicine

## 2023-11-03 MED ORDER — BACLOFEN 10 MG PO TABS: 10.0000 mg | ORAL_TABLET | Freq: Every day | ORAL | 0 refills | Status: DC

## 2023-11-03 MED ORDER — AMOXICILLIN 500 MG PO CAPS: ORAL_CAPSULE | ORAL | 0 refills | Status: DC

## 2023-11-03 NOTE — Telephone Encounter (Signed)
Patient inquiring about her recent PT/INR results. Please advise.

## 2023-11-03 NOTE — Progress Notes (Signed)
dysuria

## 2023-11-03 NOTE — Telephone Encounter (Signed)
Patient has been having back pain for a couple of days. Is afraid she may have pulled a muscle in her back. Completed a UA yesterday that came back clear except for leukocytes in the urine. Does she need an antibiotic? She was like a muscle relaxer for her back pain. Please advise.

## 2023-11-06 ENCOUNTER — Ambulatory Visit: Payer: Managed Care, Other (non HMO) | Admitting: Cardiology

## 2023-11-07 ENCOUNTER — Ambulatory Visit: Payer: Managed Care, Other (non HMO)

## 2023-11-14 ENCOUNTER — Ambulatory Visit (INDEPENDENT_AMBULATORY_CARE_PROVIDER_SITE_OTHER): Payer: Managed Care, Other (non HMO)

## 2023-11-14 ENCOUNTER — Other Ambulatory Visit: Payer: Managed Care, Other (non HMO)

## 2023-11-14 DIAGNOSIS — R072 Precordial pain: Secondary | ICD-10-CM | POA: Diagnosis not present

## 2023-11-14 MED ORDER — IOHEXOL 350 MG/ML SOLN
100.0000 mL | Freq: Once | INTRAVENOUS | Status: AC | PRN
  Administered 2023-11-14: 100 mL via INTRAVENOUS

## 2023-11-15 ENCOUNTER — Encounter: Payer: Self-pay | Admitting: Cardiovascular Disease

## 2023-11-17 NOTE — Progress Notes (Signed)
 Patient informed.

## 2023-11-20 ENCOUNTER — Encounter: Payer: Self-pay | Admitting: Cardiovascular Disease

## 2023-12-02 ENCOUNTER — Other Ambulatory Visit: Payer: Self-pay | Admitting: Cardiovascular Disease

## 2023-12-03 ENCOUNTER — Other Ambulatory Visit: Payer: Self-pay | Admitting: Cardiovascular Disease

## 2023-12-08 ENCOUNTER — Other Ambulatory Visit: Payer: Self-pay

## 2023-12-08 ENCOUNTER — Encounter: Payer: Self-pay | Admitting: Cardiovascular Disease

## 2023-12-08 ENCOUNTER — Ambulatory Visit (INDEPENDENT_AMBULATORY_CARE_PROVIDER_SITE_OTHER): Payer: Managed Care, Other (non HMO) | Admitting: Cardiovascular Disease

## 2023-12-08 VITALS — BP 112/64 | HR 70 | Ht 62.0 in | Wt 147.0 lb

## 2023-12-08 DIAGNOSIS — E782 Mixed hyperlipidemia: Secondary | ICD-10-CM

## 2023-12-08 DIAGNOSIS — Z8679 Personal history of other diseases of the circulatory system: Secondary | ICD-10-CM

## 2023-12-08 DIAGNOSIS — I350 Nonrheumatic aortic (valve) stenosis: Secondary | ICD-10-CM

## 2023-12-08 DIAGNOSIS — Z952 Presence of prosthetic heart valve: Secondary | ICD-10-CM

## 2023-12-08 DIAGNOSIS — Z950 Presence of cardiac pacemaker: Secondary | ICD-10-CM | POA: Diagnosis not present

## 2023-12-08 DIAGNOSIS — Z0130 Encounter for examination of blood pressure without abnormal findings: Secondary | ICD-10-CM

## 2023-12-08 DIAGNOSIS — Z9889 Other specified postprocedural states: Secondary | ICD-10-CM | POA: Diagnosis not present

## 2023-12-08 DIAGNOSIS — I480 Paroxysmal atrial fibrillation: Secondary | ICD-10-CM

## 2023-12-08 NOTE — Progress Notes (Signed)
Cardiology Office Note   Date:  12/08/2023   ID:  Kristin, Ferrell May 05, 1946, MRN [id number]  PCP:  Miki Kins, FNP  Cardiologist:  Adrian Blackwater, MD      History of Present Illness: Kristin Ferrell is a 78 y.o. female who presents for  Chief Complaint  Patient presents with   Follow-up    Follow Up    Feels weak occasionally but has AFIB with having ablation in past.      Past Medical History:  Diagnosis Date   Anxiety    Aortic stenosis    Arthritis    CHF (congestive heart failure) (HCC)    Dysrhythmia    Hyperlipidemia    Hypertension    Hypothyroidism    Mitral regurgitation    Tricuspid regurgitation    Vitamin D deficiency      Past Surgical History:  Procedure Laterality Date   BREAST BIOPSY  90s   stereo bx    CARDIAC ELECTROPHYSIOLOGY STUDY AND ABLATION     COLONOSCOPY WITH PROPOFOL N/A 03/05/2018   Procedure: COLONOSCOPY WITH PROPOFOL;  Surgeon: Scot Jun, MD;  Location: [facility] ENDOSCOPY;  Service: Endoscopy;  Laterality: N/A;   MECHANICAL AORTIC VALVE REPLACEMENT  2007   pace maker  2015     Current Outpatient Medications  Medication Sig Dispense Refill   Cholecalciferol (VITAMIN D3) 2000 units capsule Take by mouth.     furosemide (LASIX) 20 MG tablet TAKE 1 TABLET BY MOUTH EVERY DAY 90 tablet 1   lisinopril (ZESTRIL) 5 MG tablet TAKE 1 TABLET BY MOUTH DAILY 90 tablet 1   metoprolol succinate (TOPROL-XL) 100 MG 24 hr tablet TAKE 1 TABLET BY MOUTH DAILY 90 tablet 3   warfarin (COUMADIN) 6 MG tablet TAKE 1 TABLET BY MOUTH DAILY 90 tablet 1   No current facility-administered medications for this visit.    Allergies:   Hydralazine    Social History:   reports that she has never smoked. She has never used smokeless tobacco. She reports that she does not drink alcohol and does not use drugs.   Family History:  family history is not on file.    ROS:     Review of Systems  Constitutional: Negative.   HENT: Negative.     Eyes: Negative.   Respiratory: Negative.    Gastrointestinal: Negative.   Genitourinary: Negative.   Musculoskeletal: Negative.   Skin: Negative.   Neurological: Negative.   Endo/Heme/Allergies: Negative.   Psychiatric/Behavioral: Negative.    All other systems reviewed and are negative.     All other systems are reviewed and negative.    PHYSICAL EXAM: VS:  BP 112/64   Pulse 70   Ht 5\' 2"  (1.575 m)   Wt 147 lb (66.7 kg)   SpO2 97%   BMI 26.89 kg/m  , BMI Body mass index is 26.89 kg/m. Last weight:  Wt Readings from Last 3 Encounters:  12/08/23 147 lb (66.7 kg)  10/04/23 148 lb (67.1 kg)  09/06/23 146 lb 8 oz (66.5 kg)     Physical Exam Constitutional:      Appearance: Normal appearance.  Cardiovascular:     Rate and Rhythm: Normal rate and regular rhythm.     Heart sounds: Normal heart sounds.  Pulmonary:     Effort: Pulmonary effort is normal.     Breath sounds: Normal breath sounds.  Musculoskeletal:     Right lower leg: No edema.     Left lower  leg: No edema.  Neurological:     Mental Status: She is alert.       EKG:   Recent Labs: 05/30/2023: TSH 2.950 09/29/2023: ALT 10; BUN 13; Creatinine, Ser 1.04; Hemoglobin 13.2; Platelets 175; Potassium 4.8; Sodium 143    Lipid Panel    Component Value Date/Time   CHOL 172 09/29/2023 1345   TRIG 73 09/29/2023 1345   HDL 66 09/29/2023 1345   CHOLHDL 2.6 09/29/2023 1345   LDLCALC 92 09/29/2023 1345      Other studies Reviewed: Additional studies/ records that were reviewed today include:  Review of the above records demonstrates:       No data to display            ASSESSMENT AND PLAN:  No diagnosis found.   Problem List Items Addressed This Visit   None      Disposition:   No follow-ups on file.    Total time spent: 30 minutes  Signed,  Adrian Blackwater, MD  12/08/2023 2:27 PM    [facility]

## 2023-12-15 ENCOUNTER — Other Ambulatory Visit: Payer: Managed Care, Other (non HMO)

## 2023-12-15 DIAGNOSIS — I480 Paroxysmal atrial fibrillation: Secondary | ICD-10-CM

## 2023-12-15 DIAGNOSIS — R7303 Prediabetes: Secondary | ICD-10-CM

## 2023-12-15 DIAGNOSIS — E0590 Thyrotoxicosis, unspecified without thyrotoxic crisis or storm: Secondary | ICD-10-CM

## 2023-12-15 DIAGNOSIS — E782 Mixed hyperlipidemia: Secondary | ICD-10-CM

## 2023-12-15 DIAGNOSIS — I10 Essential (primary) hypertension: Secondary | ICD-10-CM

## 2023-12-16 LAB — CMP14+EGFR
ALT: 13 [IU]/L (ref 0–32)
AST: 26 [IU]/L (ref 0–40)
Albumin: 4.4 g/dL (ref 3.8–4.8)
Alkaline Phosphatase: 79 [IU]/L (ref 44–121)
BUN/Creatinine Ratio: 9 — ABNORMAL LOW (ref 12–28)
BUN: 10 mg/dL (ref 8–27)
Bilirubin Total: 1.9 mg/dL — ABNORMAL HIGH (ref 0.0–1.2)
CO2: 24 mmol/L (ref 20–29)
Calcium: 9.8 mg/dL (ref 8.7–10.3)
Chloride: 101 mmol/L (ref 96–106)
Creatinine, Ser: 1.06 mg/dL — ABNORMAL HIGH (ref 0.57–1.00)
Globulin, Total: 2.4 g/dL (ref 1.5–4.5)
Glucose: 96 mg/dL (ref 70–99)
Potassium: 4.3 mmol/L (ref 3.5–5.2)
Sodium: 140 mmol/L (ref 134–144)
Total Protein: 6.8 g/dL (ref 6.0–8.5)
eGFR: 54 mL/min/{1.73_m2} — ABNORMAL LOW (ref >59)

## 2023-12-16 LAB — APTT: aPTT: 33 s (ref 24–33)

## 2023-12-16 LAB — LIPID PANEL
Chol/HDL Ratio: 2.7 {ratio} (ref 0.0–4.4)
Cholesterol, Total: 190 mg/dL (ref 100–199)
HDL: 70 mg/dL (ref >39)
LDL Chol Calc (NIH): 105 mg/dL — ABNORMAL HIGH (ref 0–99)
Triglycerides: 81 mg/dL (ref 0–149)
VLDL Cholesterol Cal: 15 mg/dL (ref 5–40)

## 2023-12-16 LAB — HEMOGLOBIN A1C
Est. average glucose Bld gHb Est-mCnc: 126 mg/dL
Hgb A1c MFr Bld: 6 % — ABNORMAL HIGH (ref 4.8–5.6)

## 2023-12-16 LAB — TSH: TSH: 1.68 u[IU]/mL (ref 0.450–4.500)

## 2023-12-20 ENCOUNTER — Ambulatory Visit (INDEPENDENT_AMBULATORY_CARE_PROVIDER_SITE_OTHER): Payer: Managed Care, Other (non HMO) | Admitting: Family

## 2023-12-20 ENCOUNTER — Encounter: Payer: Self-pay | Admitting: Family

## 2023-12-20 VITALS — BP 116/74 | HR 71 | Ht 65.0 in | Wt 150.0 lb

## 2023-12-20 DIAGNOSIS — I480 Paroxysmal atrial fibrillation: Secondary | ICD-10-CM

## 2023-12-20 DIAGNOSIS — N1831 Chronic kidney disease, stage 3a: Secondary | ICD-10-CM

## 2023-12-20 DIAGNOSIS — R7303 Prediabetes: Secondary | ICD-10-CM | POA: Diagnosis not present

## 2023-12-20 DIAGNOSIS — E782 Mixed hyperlipidemia: Secondary | ICD-10-CM

## 2023-12-20 DIAGNOSIS — Z0130 Encounter for examination of blood pressure without abnormal findings: Secondary | ICD-10-CM

## 2023-12-20 DIAGNOSIS — I50814 Right heart failure due to left heart failure: Secondary | ICD-10-CM

## 2023-12-22 MED ORDER — SIMVASTATIN 20 MG PO TABS: 20.0000 mg | ORAL_TABLET | Freq: Every day | ORAL | 3 refills | Status: DC

## 2023-12-29 ENCOUNTER — Other Ambulatory Visit: Payer: Self-pay | Admitting: Internal Medicine

## 2023-12-29 ENCOUNTER — Other Ambulatory Visit: Payer: Self-pay | Admitting: Cardiovascular Disease

## 2024-01-01 ENCOUNTER — Other Ambulatory Visit: Payer: Self-pay

## 2024-01-01 ENCOUNTER — Other Ambulatory Visit: Payer: Self-pay | Admitting: Cardiovascular Disease

## 2024-01-02 ENCOUNTER — Other Ambulatory Visit: Payer: Self-pay | Admitting: Family

## 2024-01-02 MED ORDER — FLUTICASONE PROPIONATE 50 MCG/ACT NA SUSP: 1.0000 | Freq: Every day | NASAL | 2 refills | Status: AC

## 2024-01-30 ENCOUNTER — Other Ambulatory Visit

## 2024-01-30 DIAGNOSIS — I480 Paroxysmal atrial fibrillation: Secondary | ICD-10-CM

## 2024-01-31 LAB — APTT: aPTT: 36 s — ABNORMAL HIGH (ref 24–33)

## 2024-02-16 ENCOUNTER — Telehealth: Payer: Self-pay

## 2024-02-16 MED ORDER — BENZONATATE 100 MG PO CAPS: 100.0000 mg | ORAL_CAPSULE | Freq: Three times a day (TID) | ORAL | 1 refills | Status: DC | PRN

## 2024-02-16 MED ORDER — AMOXICILLIN-POT CLAVULANATE 500-125 MG PO TABS: 500.0000 mg | ORAL_TABLET | Freq: Two times a day (BID) | ORAL | 0 refills | Status: DC

## 2024-02-16 NOTE — Telephone Encounter (Signed)
 Patient called stating she was having cough, sneezing, mucus since satirday when she was out side with all the pollen, per [facility] verbal go ahead and send in tessalon perles and augmentin 500-125mg  for pt

## 2024-02-19 ENCOUNTER — Encounter: Payer: Self-pay | Admitting: Family

## 2024-02-19 NOTE — Assessment & Plan Note (Addendum)
 Continue current therapy for lipid control. Will modify as needed based on labwork results.

## 2024-02-19 NOTE — Assessment & Plan Note (Signed)
 A1C Continues to be in prediabetic ranges.  Will reassess at follow up after next lab check.  Patient counseled on dietary choices and verbalized understanding.

## 2024-02-19 NOTE — Assessment & Plan Note (Signed)
 Patient is seen by Cardiology, who manage this condition.  She is well controlled with current therapy.   Will defer to them for further changes to plan of care.

## 2024-02-19 NOTE — Assessment & Plan Note (Addendum)
 Patient is seen by Nephrology, who manage this condition.  She is well controlled with current therapy.   Will defer to them for further changes to plan of care.

## 2024-02-19 NOTE — Progress Notes (Signed)
 Established Patient Office Visit  Subjective:  Patient ID: Kristin Ferrell, female    DOB: April 16, 1946  Age: 78 y.o. MRN: [id number]  Chief Complaint  Patient presents with   Follow-up    Patient is here today for her 3 months follow up.  She has been feeling fairly well since last appointment.   She does not have additional concerns to discuss today.  Labs were done previously, so we will review these in detail today. She needs refills.   I have reviewed her active problem list, medication list, allergies, health maintenance, notes from last encounter, lab results for her appointment today.      No other concerns at this time.   Past Medical History:  Diagnosis Date   Anxiety    Aortic stenosis    Arthritis    CHF (congestive heart failure) (HCC)    Dysrhythmia    Hyperlipidemia    Hypertension    Hypothyroidism    Mitral regurgitation    Tricuspid regurgitation    Vitamin D deficiency     Past Surgical History:  Procedure Laterality Date   BREAST BIOPSY  90s   stereo bx    CARDIAC ELECTROPHYSIOLOGY STUDY AND ABLATION     COLONOSCOPY WITH PROPOFOL N/A 03/05/2018   Procedure: COLONOSCOPY WITH PROPOFOL;  Surgeon: Scot Jun, MD;  Location: [facility] ENDOSCOPY;  Service: Endoscopy;  Laterality: N/A;   MECHANICAL AORTIC VALVE REPLACEMENT  2007   pace maker  2015    Social History   Socioeconomic History   Marital status: Married    Spouse name: Not on file   Number of children: Not on file   Years of education: Not on file   Highest education level: Not on file  Occupational History   Not on file  Tobacco Use   Smoking status: Never   Smokeless tobacco: Never  Vaping Use   Vaping status: Never Used  Substance and Sexual Activity   Alcohol use: No   Drug use: No   Sexual activity: Never    Partners: Male    Birth control/protection: None  Other Topics Concern   Not on file  Social History Narrative   Not on file   Social Drivers of Health    Financial Resource Strain: Not on file  Food Insecurity: Not on file  Transportation Needs: Not on file  Physical Activity: Inactive (09/11/2019)   Exercise Vital Sign    Days of Exercise per Week: 0 days    Minutes of Exercise per Session: 0 min  Stress: No Stress Concern Present (09/11/2019)   [employer] of Occupational Health - Occupational Stress Questionnaire    Feeling of Stress : Only a little  Social Connections: Socially Integrated (09/11/2019)   Social Connection and Isolation Panel [NHANES]    Frequency of Communication with Friends and Family: More than three times a week    Frequency of Social Gatherings with Friends and Family: Twice a week    Attends Religious Services: More than 4 times per year    Active Member of [employer] or Organizations: Yes    Attends Banker Meetings: More than 4 times per year    Marital Status: Married  Catering manager Violence: Not At Risk (09/11/2019)   Humiliation, Afraid, Rape, and Kick questionnaire    Fear of Current or Ex-Partner: No    Emotionally Abused: No    Physically Abused: No    Sexually Abused: No    Family History  Problem  Relation Age of Onset   Breast cancer Neg Hx     Allergies  Allergen Reactions   Hydralazine Other (See Comments) and Shortness Of Breath    hyptoension    Review of Systems  All other systems reviewed and are negative.      Objective:   BP 116/74   Pulse 71   Ht 5\' 5"  (1.651 m)   Wt 150 lb (68 kg)   SpO2 97%   BMI 24.96 kg/m   Vitals:   12/20/23 1508  BP: 116/74  Pulse: 71  Height: 5\' 5"  (1.651 m)  Weight: 150 lb (68 kg)  SpO2: 97%  BMI (Calculated): 24.96    Physical Exam Vitals and nursing note reviewed.  Constitutional:      Appearance: Normal appearance. She is normal weight.  HENT:     Head: Normocephalic and atraumatic.  Eyes:     Extraocular Movements: Extraocular movements intact.     Conjunctiva/sclera: Conjunctivae normal.     Pupils: Pupils  are equal, round, and reactive to light.  Cardiovascular:     Rate and Rhythm: Normal rate.  Pulmonary:     Effort: Pulmonary effort is normal.  Neurological:     General: No focal deficit present.     Mental Status: She is alert and oriented to person, place, and time. Mental status is at baseline.  Psychiatric:        Mood and Affect: Mood normal.        Behavior: Behavior normal.        Thought Content: Thought content normal.      No results found for any visits on 12/20/23.  Recent Results (from the past 2160 hours)  Hemoglobin A1c     Status: Abnormal   Collection Time: 12/15/23 11:54 AM  Result Value Ref Range   Hgb A1c MFr Bld 6.0 (H) 4.8 - 5.6 %    Comment:          Prediabetes: 5.7 - 6.4          Diabetes: >6.4          Glycemic control for adults with diabetes: <7.0    Est. average glucose Bld gHb Est-mCnc 126 mg/dL  TSH     Status: None   Collection Time: 12/15/23 11:54 AM  Result Value Ref Range   TSH 1.680 0.450 - 4.500 uIU/mL  CMP14+EGFR     Status: Abnormal   Collection Time: 12/15/23 11:54 AM  Result Value Ref Range   Glucose 96 70 - 99 mg/dL   BUN 10 8 - 27 mg/dL   Creatinine, Ser 1.61 (H) 0.57 - 1.00 mg/dL   eGFR 54 (L) >09 UE/AVW/0.98   BUN/Creatinine Ratio 9 (L) 12 - 28   Sodium 140 134 - 144 mmol/L   Potassium 4.3 3.5 - 5.2 mmol/L   Chloride 101 96 - 106 mmol/L   CO2 24 20 - 29 mmol/L   Calcium 9.8 8.7 - 10.3 mg/dL   Total Protein 6.8 6.0 - 8.5 g/dL   Albumin 4.4 3.8 - 4.8 g/dL   Globulin, Total 2.4 1.5 - 4.5 g/dL   Bilirubin Total 1.9 (H) 0.0 - 1.2 mg/dL   Alkaline Phosphatase 79 44 - 121 IU/L   AST 26 0 - 40 IU/L   ALT 13 0 - 32 IU/L  Lipid panel     Status: Abnormal   Collection Time: 12/15/23 11:54 AM  Result Value Ref Range   Cholesterol, Total 190 100 - 199  mg/dL   Triglycerides 81 0 - 149 mg/dL   HDL 70 >14 mg/dL   VLDL Cholesterol Cal 15 5 - 40 mg/dL   LDL Chol Calc (NIH) 782 (H) 0 - 99 mg/dL   Chol/HDL Ratio 2.7 0.0 - 4.4  ratio    Comment:                                   T. Chol/HDL Ratio                                             Men  Women                               1/2 Avg.Risk  3.4    3.3                                   Avg.Risk  5.0    4.4                                2X Avg.Risk  9.6    7.1                                3X Avg.Risk 23.4   11.0   APTT     Status: None   Collection Time: 12/15/23 12:00 PM  Result Value Ref Range   aPTT 33 24 - 33 sec    Comment: This test has not been validated for monitoring unfractionated heparin therapy. aPTT-based therapeutic ranges for unfractionated heparin therapy have not been established. For general guidelines on Heparin monitoring, refer to the [employer].   APTT     Status: Abnormal   Collection Time: 01/30/24 11:52 AM  Result Value Ref Range   aPTT 36 (H) 24 - 33 sec    Comment: This test has not been validated for monitoring unfractionated heparin therapy. aPTT-based therapeutic ranges for unfractionated heparin therapy have not been established. For general guidelines on Heparin monitoring, refer to the [employer].        Assessment & Plan:   Problem List Items Addressed This Visit       Cardiovascular and Mediastinum   Paroxysmal atrial fibrillation (HCC) (Chronic)   Patient is seen by Cardiology, who manage this condition.  She is well controlled with current therapy.   Will defer to them for further changes to plan of care.       CHF (congestive heart failure) (HCC)   Patient is seen by Cardiology, who manage this condition.  She is well controlled with current therapy.   Will defer to them for further changes to plan of care.         Genitourinary   CKD (chronic kidney disease) stage 3, GFR 30-59 ml/min (HCC)   Patient is seen by Nephrology, who manage this condition.  She is well controlled with current therapy.   Will defer to them for further changes to plan of  care.          Other  Prediabetes   A1C Continues to be in prediabetic ranges.  Will reassess at follow up after next lab check.  Patient counseled on dietary choices and verbalized understanding.        Mixed hyperlipidemia - Primary   Continue current therapy for lipid control. Will modify as needed based on labwork results.         Return in about 3 months (around 03/18/2024).   Total time spent: 20 minutes  Trenda Frisk, FNP  12/20/2023   This document may have been prepared by [facility] Voice Recognition software and as such may include unintentional dictation errors.

## 2024-03-01 ENCOUNTER — Ambulatory Visit: Admitting: Family

## 2024-03-01 DIAGNOSIS — R8290 Unspecified abnormal findings in urine: Secondary | ICD-10-CM

## 2024-03-01 LAB — POCT URINALYSIS DIPSTICK
Bilirubin, UA: NEGATIVE
Blood, UA: NEGATIVE
Glucose, UA: NEGATIVE
Ketones, UA: NEGATIVE
Nitrite, UA: NEGATIVE
Protein, UA: NEGATIVE
Spec Grav, UA: 1.01 (ref 1.010–1.025)
Urobilinogen, UA: 0.2 U/dL
pH, UA: 6 (ref 5.0–8.0)

## 2024-03-05 ENCOUNTER — Other Ambulatory Visit: Payer: Self-pay

## 2024-03-05 ENCOUNTER — Ambulatory Visit (INDEPENDENT_AMBULATORY_CARE_PROVIDER_SITE_OTHER): Admitting: Family

## 2024-03-05 ENCOUNTER — Encounter: Payer: Self-pay | Admitting: Family

## 2024-03-05 VITALS — BP 112/66 | HR 70 | Ht 65.0 in | Wt 152.4 lb

## 2024-03-05 DIAGNOSIS — E782 Mixed hyperlipidemia: Secondary | ICD-10-CM

## 2024-03-05 DIAGNOSIS — N1831 Chronic kidney disease, stage 3a: Secondary | ICD-10-CM | POA: Diagnosis not present

## 2024-03-05 DIAGNOSIS — E0590 Thyrotoxicosis, unspecified without thyrotoxic crisis or storm: Secondary | ICD-10-CM

## 2024-03-05 DIAGNOSIS — R7303 Prediabetes: Secondary | ICD-10-CM

## 2024-03-05 DIAGNOSIS — M545 Low back pain, unspecified: Secondary | ICD-10-CM

## 2024-03-05 DIAGNOSIS — Z0130 Encounter for examination of blood pressure without abnormal findings: Secondary | ICD-10-CM

## 2024-03-05 MED ORDER — CIPROFLOXACIN HCL 500 MG PO TABS: 500.0000 mg | ORAL_TABLET | Freq: Every day | ORAL | 0 refills | Status: DC

## 2024-03-05 NOTE — Progress Notes (Unsigned)
 Established Patient Office Visit  Subjective:  Patient ID: Kristin Ferrell, female    DOB: 03/16/1946  Age: 78 y.o. MRN: [id number]  Chief Complaint  Patient presents with  . Follow-up    Patient is here today for her 3 months follow up.  She has been feeling fairly well since last appointment.   She does have additional concerns to discuss today.  She has been having some pain in her lower back, says that this is significant enough that it is affecting her daily activities.   Labs were done prior to appointment, will discuss in detail today.  She needs refills.   I have reviewed her active problem list, medication list, allergies, notes from last encounter, lab results for her appointment today.      No other concerns at this time.   Past Medical History:  Diagnosis Date  . Anxiety   . Aortic stenosis   . Arthritis   . CHF (congestive heart failure) (HCC)   . Dysrhythmia   . Hyperlipidemia   . Hypertension   . Hypothyroidism   . Mitral regurgitation   . Tricuspid regurgitation   . Vitamin D  deficiency     Past Surgical History:  Procedure Laterality Date  . BREAST BIOPSY  90s   stereo bx   . CARDIAC ELECTROPHYSIOLOGY STUDY AND ABLATION    . COLONOSCOPY WITH PROPOFOL  N/A 03/05/2018   Procedure: COLONOSCOPY WITH PROPOFOL ;  Surgeon: Viktoria Lamar DASEN, MD;  Location: [facility] ENDOSCOPY;  Service: Endoscopy;  Laterality: N/A;  . MECHANICAL AORTIC VALVE REPLACEMENT  2007  . pace maker  2015    Social History   Socioeconomic History  . Marital status: Married    Spouse name: Not on file  . Number of children: Not on file  . Years of education: Not on file  . Highest education level: Not on file  Occupational History  . Not on file  Tobacco Use  . Smoking status: Never  . Smokeless tobacco: Never  Vaping Use  . Vaping status: Never Used  Substance and Sexual Activity  . Alcohol use: No  . Drug use: No  . Sexual activity: Never    Partners: Male    Birth  control/protection: None  Other Topics Concern  . Not on file  Social History Narrative  . Not on file   Social Drivers of Health   Financial Resource Strain: Not on file  Food Insecurity: Not on file  Transportation Needs: Not on file  Physical Activity: Inactive (09/11/2019)   Exercise Vital Sign   . Days of Exercise per Week: 0 days   . Minutes of Exercise per Session: 0 min  Stress: No Stress Concern Present (09/11/2019)   [employer] of Occupational Health - Occupational Stress Questionnaire   . Feeling of Stress : Only a little  Social Connections: Socially Integrated (09/11/2019)   Social Connection and Isolation Panel [NHANES]   . Frequency of Communication with Friends and Family: More than three times a week   . Frequency of Social Gatherings with Friends and Family: Twice a week   . Attends Religious Services: More than 4 times per year   . Active Member of Clubs or Organizations: Yes   . Attends Banker Meetings: More than 4 times per year   . Marital Status: Married  Catering manager Violence: Not At Risk (09/11/2019)   Humiliation, Afraid, Rape, and Kick questionnaire   . Fear of Current or Ex-Partner: No   .  Emotionally Abused: No   . Physically Abused: No   . Sexually Abused: No    Family History  Problem Relation Age of Onset  . Breast cancer Neg Hx     Allergies  Allergen Reactions  . Hydralazine Other (See Comments) and Shortness Of Breath    hyptoension    Review of Systems  All other systems reviewed and are negative.      Objective:   BP 112/66   Pulse 70   Ht 5' 5 (1.651 m)   Wt 152 lb 6.4 oz (69.1 kg)   SpO2 98%   BMI 25.36 kg/m   Vitals:   03/05/24 1431  BP: 112/66  Pulse: 70  Height: 5' 5 (1.651 m)  Weight: 152 lb 6.4 oz (69.1 kg)  SpO2: 98%  BMI (Calculated): 25.36    Physical Exam Vitals and nursing note reviewed.  Constitutional:      Appearance: Normal appearance. She is normal weight.  HENT:      Head: Normocephalic.  Eyes:     Extraocular Movements: Extraocular movements intact.     Conjunctiva/sclera: Conjunctivae normal.     Pupils: Pupils are equal, round, and reactive to light.  Cardiovascular:     Rate and Rhythm: Normal rate.  Pulmonary:     Effort: Pulmonary effort is normal.  Neurological:     General: No focal deficit present.     Mental Status: She is alert and oriented to person, place, and time. Mental status is at baseline.  Psychiatric:        Mood and Affect: Mood normal.        Behavior: Behavior normal.        Thought Content: Thought content normal.        Judgment: Judgment normal.      No results found for any visits on 03/05/24.  Recent Results (from the past 2160 hours)  Hemoglobin A1c     Status: Abnormal   Collection Time: 12/15/23 11:54 AM  Result Value Ref Range   Hgb A1c MFr Bld 6.0 (H) 4.8 - 5.6 %    Comment:          Prediabetes: 5.7 - 6.4          Diabetes: >6.4          Glycemic control for adults with diabetes: <7.0    Est. average glucose Bld gHb Est-mCnc 126 mg/dL  TSH     Status: None   Collection Time: 12/15/23 11:54 AM  Result Value Ref Range   TSH 1.680 0.450 - 4.500 uIU/mL  CMP14+EGFR     Status: Abnormal   Collection Time: 12/15/23 11:54 AM  Result Value Ref Range   Glucose 96 70 - 99 mg/dL   BUN 10 8 - 27 mg/dL   Creatinine, Ser 8.93 (H) 0.57 - 1.00 mg/dL   eGFR 54 (L) >40 fO/fpw/8.26   BUN/Creatinine Ratio 9 (L) 12 - 28   Sodium 140 134 - 144 mmol/L   Potassium 4.3 3.5 - 5.2 mmol/L   Chloride 101 96 - 106 mmol/L   CO2 24 20 - 29 mmol/L   Calcium 9.8 8.7 - 10.3 mg/dL   Total Protein 6.8 6.0 - 8.5 g/dL   Albumin 4.4 3.8 - 4.8 g/dL   Globulin, Total 2.4 1.5 - 4.5 g/dL   Bilirubin Total 1.9 (H) 0.0 - 1.2 mg/dL   Alkaline Phosphatase 79 44 - 121 IU/L   AST 26 0 - 40 IU/L   ALT  13 0 - 32 IU/L  Lipid panel     Status: Abnormal   Collection Time: 12/15/23 11:54 AM  Result Value Ref Range   Cholesterol, Total 190  100 - 199 mg/dL   Triglycerides 81 0 - 149 mg/dL   HDL 70 >60 mg/dL   VLDL Cholesterol Cal 15 5 - 40 mg/dL   LDL Chol Calc (NIH) 894 (H) 0 - 99 mg/dL   Chol/HDL Ratio 2.7 0.0 - 4.4 ratio    Comment:                                   T. Chol/HDL Ratio                                             Men  Women                               1/2 Avg.Risk  3.4    3.3                                   Avg.Risk  5.0    4.4                                2X Avg.Risk  9.6    7.1                                3X Avg.Risk 23.4   11.0   APTT     Status: None   Collection Time: 12/15/23 12:00 PM  Result Value Ref Range   aPTT 33 24 - 33 sec    Comment: This test has not been validated for monitoring unfractionated heparin therapy. aPTT-based therapeutic ranges for unfractionated heparin therapy have not been established. For general guidelines on Heparin monitoring, refer to the [employer].   APTT     Status: Abnormal   Collection Time: 01/30/24 11:52 AM  Result Value Ref Range   aPTT 36 (H) 24 - 33 sec    Comment: This test has not been validated for monitoring unfractionated heparin therapy. aPTT-based therapeutic ranges for unfractionated heparin therapy have not been established. For general guidelines on Heparin monitoring, refer to the [employer].   POCT Urinalysis Dipstick (18997)     Status: Abnormal   Collection Time: 03/01/24  1:37 PM  Result Value Ref Range   Color, UA     Clarity, UA     Glucose, UA Negative Negative   Bilirubin, UA Negative    Ketones, UA Negative    Spec Grav, UA 1.010 1.010 - 1.025   Blood, UA Negative    pH, UA 6.0 5.0 - 8.0   Protein, UA Negative Negative   Urobilinogen, UA 0.2 0.2 or 1.0 E.U./dL   Nitrite, UA Negative    Leukocytes, UA Moderate (2+) (A) Negative   Appearance     Odor         Assessment & Plan Acute left-sided low back pain without sciatica XR Lumbar Spine today.  Will  call pt. With results.     Stage 3a chronic kidney disease (HCC) Patient is seen by nephrology, who manage this condition.  She is well controlled with current therapy.   Will defer to them for further changes to plan of care.  Mixed hyperlipidemia Continue current therapy for lipid control. Will modify as needed based on labwork results.   Prediabetes A1C Continues to be in prediabetic ranges.  Will reassess at follow up after next lab check.  Patient counseled on dietary choices and verbalized understanding.   Hyperthyroidism Patient stable.  Well controlled with current therapy.   Continue current meds.    Return in about 3 months (around 06/04/2024).   Total time spent: 20 minutes  ALAN CHRISTELLA ARRANT, FNP  03/05/2024   This document may have been prepared by [facility] Voice Recognition software and as such may include unintentional dictation errors.

## 2024-03-12 ENCOUNTER — Other Ambulatory Visit

## 2024-03-12 ENCOUNTER — Ambulatory Visit (INDEPENDENT_AMBULATORY_CARE_PROVIDER_SITE_OTHER)

## 2024-03-12 DIAGNOSIS — I480 Paroxysmal atrial fibrillation: Secondary | ICD-10-CM

## 2024-03-12 DIAGNOSIS — M545 Low back pain, unspecified: Secondary | ICD-10-CM | POA: Diagnosis not present

## 2024-03-13 LAB — APTT: aPTT: 35 s — ABNORMAL HIGH (ref 24–33)

## 2024-03-24 NOTE — Progress Notes (Signed)
   CHIEF COMPLAINT  UA/ only visit fot UTI     REASON FOR VISIT  Possible UTI, UA Visit Only      ASSESSMENT & PLAN Diagnoses and all orders for this visit:  Abnormal urine -     POCT Urinalysis Dipstick (16109)     Patient notified.  Total time spent: 5 minutes  Trenda Frisk, FNP 03/01/2024

## 2024-05-02 ENCOUNTER — Other Ambulatory Visit: Payer: Self-pay

## 2024-05-02 DIAGNOSIS — I480 Paroxysmal atrial fibrillation: Secondary | ICD-10-CM

## 2024-05-03 ENCOUNTER — Other Ambulatory Visit

## 2024-05-03 ENCOUNTER — Other Ambulatory Visit: Payer: Self-pay | Admitting: Family

## 2024-05-03 DIAGNOSIS — Z1231 Encounter for screening mammogram for malignant neoplasm of breast: Secondary | ICD-10-CM

## 2024-05-04 LAB — APTT: aPTT: 37 s — ABNORMAL HIGH (ref 24–33)

## 2024-05-06 ENCOUNTER — Other Ambulatory Visit: Payer: Self-pay

## 2024-05-06 DIAGNOSIS — I480 Paroxysmal atrial fibrillation: Secondary | ICD-10-CM

## 2024-05-06 LAB — SPECIMEN STATUS REPORT

## 2024-05-07 ENCOUNTER — Other Ambulatory Visit: Payer: Self-pay

## 2024-05-07 ENCOUNTER — Telehealth: Payer: Self-pay | Admitting: Cardiovascular Disease

## 2024-05-07 DIAGNOSIS — E782 Mixed hyperlipidemia: Secondary | ICD-10-CM

## 2024-05-07 DIAGNOSIS — D519 Vitamin B12 deficiency anemia, unspecified: Secondary | ICD-10-CM

## 2024-05-07 DIAGNOSIS — I480 Paroxysmal atrial fibrillation: Secondary | ICD-10-CM

## 2024-05-07 DIAGNOSIS — E0590 Thyrotoxicosis, unspecified without thyrotoxic crisis or storm: Secondary | ICD-10-CM

## 2024-05-07 DIAGNOSIS — R7303 Prediabetes: Secondary | ICD-10-CM

## 2024-05-07 DIAGNOSIS — I10 Essential (primary) hypertension: Secondary | ICD-10-CM

## 2024-05-07 DIAGNOSIS — E559 Vitamin D deficiency, unspecified: Secondary | ICD-10-CM

## 2024-05-07 NOTE — Telephone Encounter (Signed)
 Patient needs INR results. Please advise.

## 2024-05-15 ENCOUNTER — Ambulatory Visit
Admission: RE | Admit: 2024-05-15 | Discharge: 2024-05-15 | Disposition: A | Discharge status: Home / Self Care | Source: Ambulatory Visit | Attending: Family | Admitting: Family

## 2024-05-15 DIAGNOSIS — Z1231 Encounter for screening mammogram for malignant neoplasm of breast: Secondary | ICD-10-CM | POA: Insufficient documentation

## 2024-05-17 ENCOUNTER — Other Ambulatory Visit

## 2024-05-17 DIAGNOSIS — I10 Essential (primary) hypertension: Secondary | ICD-10-CM

## 2024-05-17 DIAGNOSIS — I480 Paroxysmal atrial fibrillation: Secondary | ICD-10-CM

## 2024-05-18 LAB — CMP14+EGFR
ALT: 12 IU/L (ref 0–32)
AST: 25 IU/L (ref 0–40)
Albumin: 4.5 g/dL (ref 3.8–4.8)
Alkaline Phosphatase: 82 IU/L (ref 44–121)
BUN/Creatinine Ratio: 12 (ref 12–28)
BUN: 13 mg/dL (ref 8–27)
Bilirubin Total: 2.2 mg/dL — ABNORMAL HIGH (ref 0.0–1.2)
CO2: 19 mmol/L — ABNORMAL LOW (ref 20–29)
Calcium: 9.5 mg/dL (ref 8.7–10.3)
Chloride: 100 mmol/L (ref 96–106)
Creatinine, Ser: 1.11 mg/dL — ABNORMAL HIGH (ref 0.57–1.00)
Globulin, Total: 2.5 g/dL (ref 1.5–4.5)
Glucose: 103 mg/dL — ABNORMAL HIGH (ref 70–99)
Potassium: 4.4 mmol/L (ref 3.5–5.2)
Sodium: 138 mmol/L (ref 134–144)
Total Protein: 7 g/dL (ref 6.0–8.5)
eGFR: 52 mL/min/1.73 — ABNORMAL LOW (ref >59)

## 2024-05-18 LAB — LIPID PANEL
Chol/HDL Ratio: 3 ratio (ref 0.0–4.4)
Cholesterol, Total: 198 mg/dL (ref 100–199)
HDL: 66 mg/dL (ref >39)
LDL Chol Calc (NIH): 113 mg/dL — ABNORMAL HIGH (ref 0–99)
Triglycerides: 107 mg/dL (ref 0–149)
VLDL Cholesterol Cal: 19 mg/dL (ref 5–40)

## 2024-05-18 LAB — PROTIME-INR
INR: 2 — ABNORMAL HIGH (ref 0.9–1.2)
Prothrombin Time: 21.2 s — ABNORMAL HIGH (ref 9.1–12.0)

## 2024-05-18 LAB — HEMOGLOBIN A1C
Est. average glucose Bld gHb Est-mCnc: 111 mg/dL
Hgb A1c MFr Bld: 5.5 % (ref 4.8–5.6)

## 2024-05-18 LAB — TSH: TSH: 1.8 u[IU]/mL (ref 0.450–4.500)

## 2024-05-18 LAB — VITAMIN B12: Vitamin B-12: 394 pg/mL (ref 232–1245)

## 2024-05-18 LAB — VITAMIN D 25 HYDROXY (VIT D DEFICIENCY, FRACTURES): Vit D, 25-Hydroxy: 62 ng/mL (ref 30.0–100.0)

## 2024-05-24 ENCOUNTER — Encounter: Payer: Self-pay | Admitting: Cardiovascular Disease

## 2024-05-24 ENCOUNTER — Ambulatory Visit (INDEPENDENT_AMBULATORY_CARE_PROVIDER_SITE_OTHER): Admitting: Cardiovascular Disease

## 2024-05-24 VITALS — BP 110/65 | HR 70 | Ht 65.0 in | Wt 150.2 lb

## 2024-05-24 DIAGNOSIS — E782 Mixed hyperlipidemia: Secondary | ICD-10-CM

## 2024-05-24 DIAGNOSIS — I350 Nonrheumatic aortic (valve) stenosis: Secondary | ICD-10-CM

## 2024-05-24 DIAGNOSIS — Z950 Presence of cardiac pacemaker: Secondary | ICD-10-CM

## 2024-05-24 DIAGNOSIS — R0789 Other chest pain: Secondary | ICD-10-CM | POA: Diagnosis not present

## 2024-05-24 DIAGNOSIS — I50814 Right heart failure due to left heart failure: Secondary | ICD-10-CM | POA: Diagnosis not present

## 2024-05-24 DIAGNOSIS — I480 Paroxysmal atrial fibrillation: Secondary | ICD-10-CM | POA: Diagnosis not present

## 2024-05-24 DIAGNOSIS — Z9889 Other specified postprocedural states: Secondary | ICD-10-CM

## 2024-05-24 MED ORDER — PANTOPRAZOLE SODIUM 40 MG PO TBEC: 40.0000 mg | DELAYED_RELEASE_TABLET | Freq: Every day | ORAL | 1 refills | Status: DC

## 2024-05-24 NOTE — Progress Notes (Signed)
 Cardiology Office Note   Date:  05/24/2024   ID:  Jordain, Radin 1946-02-24, MRN [id number]  PCP:  Orlean Alan CHRISTELLA, FNP  Cardiologist:  Denyse Bathe, MD      History of Present Illness: Kristin Ferrell is a 78 y.o. female who presents for  Chief Complaint  Patient presents with   Follow-up    Follow up and discuss meds prior to dental procedure.     Last week felt lightheaded and indigestion with chest pain going across.      Past Medical History:  Diagnosis Date   Anxiety    Aortic stenosis    Arthritis    CHF (congestive heart failure) (HCC)    Dysrhythmia    Hyperlipidemia    Hypertension    Hypothyroidism    Mitral regurgitation    Tricuspid regurgitation    Vitamin D  deficiency      Past Surgical History:  Procedure Laterality Date   BREAST BIOPSY  90s   stereo bx    CARDIAC ELECTROPHYSIOLOGY STUDY AND ABLATION     COLONOSCOPY WITH PROPOFOL  N/A 03/05/2018   Procedure: COLONOSCOPY WITH PROPOFOL ;  Surgeon: Viktoria Lamar DASEN, MD;  Location: [facility] ENDOSCOPY;  Service: Endoscopy;  Laterality: N/A;   MECHANICAL AORTIC VALVE REPLACEMENT  2007   pace maker  2015     Current Outpatient Medications  Medication Sig Dispense Refill   Cholecalciferol (VITAMIN D3) 2000 units capsule Take by mouth.     fluticasone  (FLONASE ) 50 MCG/ACT nasal spray Place 1 spray into both nostrils daily. 16 g 2   furosemide (LASIX) 20 MG tablet TAKE 1 TABLET BY MOUTH EVERY DAY 90 tablet 1   lisinopril (ZESTRIL) 5 MG tablet TAKE 1 TABLET BY MOUTH DAILY 90 tablet 1   metoprolol  succinate (TOPROL -XL) 100 MG 24 hr tablet TAKE 1 TABLET(100 MG) BY MOUTH DAILY 90 tablet 3   pantoprazole  (PROTONIX ) 40 MG tablet Take 1 tablet (40 mg total) by mouth daily. 30 tablet 1   simvastatin  (ZOCOR ) 20 MG tablet Take 1 tablet (20 mg total) by mouth daily. 90 tablet 3   warfarin (COUMADIN ) 6 MG tablet TAKE 1 TABLET BY MOUTH AS DIRECTED 90 tablet 1   No current facility-administered medications  for this visit.    Allergies:   Hydralazine    Social History:   reports that she has never smoked. She has never used smokeless tobacco. She reports that she does not drink alcohol and does not use drugs.   Family History:  family history is not on file.    ROS:     Review of Systems  Constitutional: Negative.   HENT: Negative.    Eyes: Negative.   Respiratory: Negative.    Gastrointestinal: Negative.   Genitourinary: Negative.   Musculoskeletal: Negative.   Skin: Negative.   Neurological: Negative.   Endo/Heme/Allergies: Negative.   Psychiatric/Behavioral: Negative.    All other systems reviewed and are negative.     All other systems are reviewed and negative.    PHYSICAL EXAM: VS:  BP 110/65   Pulse 70   Ht 5' 5 (1.651 m)   Wt 150 lb 3.2 oz (68.1 kg)   SpO2 98%   BMI 24.99 kg/m  , BMI Body mass index is 24.99 kg/m. Last weight:  Wt Readings from Last 3 Encounters:  05/24/24 150 lb 3.2 oz (68.1 kg)  03/05/24 152 lb 6.4 oz (69.1 kg)  12/20/23 150 lb (68 kg)     Physical  Exam Constitutional:      Appearance: Normal appearance.  Cardiovascular:     Rate and Rhythm: Normal rate and regular rhythm.     Heart sounds: Normal heart sounds.  Pulmonary:     Effort: Pulmonary effort is normal.     Breath sounds: Normal breath sounds.  Musculoskeletal:     Right lower leg: No edema.     Left lower leg: No edema.  Neurological:     Mental Status: She is alert.       EKG:   Recent Labs: 09/29/2023: Hemoglobin 13.2; Platelets 175 05/17/2024: ALT 12; BUN 13; Creatinine, Ser 1.11; Potassium 4.4; Sodium 138; TSH 1.800    Lipid Panel    Component Value Date/Time   CHOL 198 05/17/2024 0915   TRIG 107 05/17/2024 0915   HDL 66 05/17/2024 0915   CHOLHDL 3.0 05/17/2024 0915   LDLCALC 113 (H) 05/17/2024 0915      Other studies Reviewed: Additional studies/ records that were reviewed today include:  Review of the above records demonstrates:       No  data to display            ASSESSMENT AND PLAN:    ICD-10-CM   1. Chest pain, non-cardiac  R07.89    had normal coronaries, in past prior PPM, and stress test 12/24 ok. Probably GERd, as stopped protonix . restart.    2. Nonrheumatic aortic valve stenosis  I35.0 pantoprazole  (PROTONIX ) 40 MG tablet    3. 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 Paroxysmal atrial fibrillation (HCC)  I48.0 pantoprazole  (PROTONIX ) 40 MG tablet    5. S/P ablation of atrial fibrillation  Z98.890 pantoprazole  (PROTONIX ) 40 MG tablet   Z86.79     6. Mixed hyperlipidemia  E78.2 pantoprazole  (PROTONIX ) 40 MG tablet    7. Cardiac pacemaker in situ  Z95.0 pantoprazole  (PROTONIX ) 40 MG tablet       Problem List Items Addressed This Visit       Cardiovascular and Mediastinum   Paroxysmal atrial fibrillation (HCC) (Chronic)   Relevant Medications   pantoprazole  (PROTONIX ) 40 MG tablet   Aortic stenosis   Relevant Medications   pantoprazole  (PROTONIX ) 40 MG tablet   CHF (congestive heart failure) (HCC)   Relevant Medications   pantoprazole  (PROTONIX ) 40 MG tablet     Other   Mixed hyperlipidemia   Relevant Medications   pantoprazole  (PROTONIX ) 40 MG tablet   Cardiac pacemaker in situ   Relevant Medications   pantoprazole  (PROTONIX ) 40 MG tablet   S/P ablation of atrial fibrillation   Relevant Medications   pantoprazole  (PROTONIX ) 40 MG tablet   Other Visit Diagnoses       Chest pain, non-cardiac    -  Primary   had normal coronaries, in past prior PPM, and stress test 12/24 ok. Probably GERd, as stopped protonix . restart.          Disposition:   Return in about 4 weeks (around 06/21/2024).    Total time spent: 30 minutes  Signed,  Denyse Bathe, MD  05/24/2024 9:50 AM    [facility]

## 2024-05-26 ENCOUNTER — Encounter: Payer: Self-pay | Admitting: Family

## 2024-05-26 NOTE — Assessment & Plan Note (Signed)
 A1C Continues to be in prediabetic ranges.  Will reassess at follow up after next lab check.  Patient counseled on dietary choices and verbalized understanding.

## 2024-05-26 NOTE — Assessment & Plan Note (Signed)
Patient is seen by nephrology, who manage this condition.  She is well controlled with current therapy.   Will defer to them for further changes to plan of care.

## 2024-05-26 NOTE — Assessment & Plan Note (Signed)
 Patient stable.  Well controlled with current therapy.   Continue current meds.

## 2024-05-26 NOTE — Assessment & Plan Note (Signed)
 Continue current therapy for lipid control. Will modify as needed based on labwork results.

## 2024-05-27 ENCOUNTER — Other Ambulatory Visit: Payer: Self-pay | Admitting: Nurse Practitioner

## 2024-05-27 ENCOUNTER — Other Ambulatory Visit: Payer: Self-pay | Admitting: Cardiovascular Disease

## 2024-05-27 ENCOUNTER — Ambulatory Visit: Payer: Self-pay

## 2024-05-27 DIAGNOSIS — I480 Paroxysmal atrial fibrillation: Secondary | ICD-10-CM

## 2024-05-27 DIAGNOSIS — I350 Nonrheumatic aortic (valve) stenosis: Secondary | ICD-10-CM

## 2024-05-27 DIAGNOSIS — I50814 Right heart failure due to left heart failure: Secondary | ICD-10-CM

## 2024-05-27 DIAGNOSIS — Z8679 Personal history of other diseases of the circulatory system: Secondary | ICD-10-CM

## 2024-05-27 DIAGNOSIS — Z950 Presence of cardiac pacemaker: Secondary | ICD-10-CM

## 2024-05-27 DIAGNOSIS — E782 Mixed hyperlipidemia: Secondary | ICD-10-CM

## 2024-05-28 ENCOUNTER — Telehealth: Payer: Self-pay | Admitting: Family

## 2024-05-28 ENCOUNTER — Ambulatory Visit: Admitting: Family

## 2024-05-28 ENCOUNTER — Encounter: Payer: Self-pay | Admitting: Family

## 2024-05-28 VITALS — BP 104/60 | HR 69 | Ht 65.0 in | Wt 150.4 lb

## 2024-05-28 DIAGNOSIS — K5904 Chronic idiopathic constipation: Secondary | ICD-10-CM

## 2024-05-28 DIAGNOSIS — E538 Deficiency of other specified B group vitamins: Secondary | ICD-10-CM

## 2024-05-28 DIAGNOSIS — R7303 Prediabetes: Secondary | ICD-10-CM

## 2024-05-28 DIAGNOSIS — E782 Mixed hyperlipidemia: Secondary | ICD-10-CM

## 2024-05-28 MED ORDER — ACETIC ACID 2 % OT SOLN: 4.0000 [drp] | Freq: Three times a day (TID) | OTIC | 0 refills | Status: AC

## 2024-05-28 MED ORDER — CYANOCOBALAMIN 1000 MCG/ML IJ SOLN
1000.0000 ug | Freq: Once | INTRAMUSCULAR | Status: AC
Start: 2024-05-28 — End: 2024-05-28
  Administered 2024-05-28: 1000 ug via INTRAMUSCULAR

## 2024-05-28 NOTE — Telephone Encounter (Signed)
 Patient came in asking when she can resume her warfarin after her tooth extraction. Please advise.

## 2024-06-21 ENCOUNTER — Ambulatory Visit: Admitting: Cardiovascular Disease

## 2024-07-04 ENCOUNTER — Ambulatory Visit (INDEPENDENT_AMBULATORY_CARE_PROVIDER_SITE_OTHER): Admitting: Cardiovascular Disease

## 2024-07-04 ENCOUNTER — Encounter: Payer: Self-pay | Admitting: Cardiovascular Disease

## 2024-07-04 VITALS — BP 104/60 | HR 70 | Ht 65.0 in | Wt 147.0 lb

## 2024-07-04 DIAGNOSIS — Z9889 Other specified postprocedural states: Secondary | ICD-10-CM

## 2024-07-04 DIAGNOSIS — R0789 Other chest pain: Secondary | ICD-10-CM | POA: Diagnosis not present

## 2024-07-04 DIAGNOSIS — E782 Mixed hyperlipidemia: Secondary | ICD-10-CM | POA: Diagnosis not present

## 2024-07-04 DIAGNOSIS — Z950 Presence of cardiac pacemaker: Secondary | ICD-10-CM

## 2024-07-04 DIAGNOSIS — Z0130 Encounter for examination of blood pressure without abnormal findings: Secondary | ICD-10-CM

## 2024-07-04 DIAGNOSIS — Z8679 Personal history of other diseases of the circulatory system: Secondary | ICD-10-CM

## 2024-07-04 DIAGNOSIS — I350 Nonrheumatic aortic (valve) stenosis: Secondary | ICD-10-CM | POA: Diagnosis not present

## 2024-07-04 LAB — CBC WITH DIFFERENTIAL/PLATELET
Basophils Absolute: 0 x10E3/uL (ref 0.0–0.2)
Basos: 0 %
EOS (ABSOLUTE): 0.1 x10E3/uL (ref 0.0–0.4)
Eos: 1 %
Hematocrit: 40.9 % (ref 34.0–46.6)
Hemoglobin: 13.4 g/dL (ref 11.1–15.9)
Immature Grans (Abs): 0 x10E3/uL (ref 0.0–0.1)
Immature Granulocytes: 0 %
Lymphocytes Absolute: 1.6 x10E3/uL (ref 0.7–3.1)
Lymphs: 22 %
MCH: 30.5 pg (ref 26.6–33.0)
MCHC: 32.8 g/dL (ref 31.5–35.7)
MCV: 93 fL (ref 79–97)
Monocytes Absolute: 0.6 x10E3/uL (ref 0.1–0.9)
Monocytes: 8 %
Neutrophils Absolute: 4.9 x10E3/uL (ref 1.4–7.0)
Neutrophils: 69 %
Platelets: 197 x10E3/uL (ref 150–450)
RBC: 4.4 x10E6/uL (ref 3.77–5.28)
RDW: 12.3 % (ref 11.7–15.4)
WBC: 7.3 x10E3/uL (ref 3.4–10.8)

## 2024-07-04 NOTE — Progress Notes (Signed)
 Cardiology Office Note   Date:  07/04/2024   ID:  Emojean, Gertz 01/31/1946, MRN [id number]  PCP:  Orlean Alan CHRISTELLA, FNP  Cardiologist:  Denyse Bathe, MD      History of Present Illness: Kristin Ferrell is a 78 y.o. female who presents for  Chief Complaint  Patient presents with   Follow-up    4 Months Follow Up    Had tooth pulled and was off coumadin  3 days and now taking it. Will recheck it today. No complaints.      Past Medical History:  Diagnosis Date   Anxiety    Aortic stenosis    Arthritis    CHF (congestive heart failure) (HCC)    Dysrhythmia    Hyperlipidemia    Hypertension    Hypothyroidism    Mitral regurgitation    Tricuspid regurgitation    Vitamin D  deficiency      Past Surgical History:  Procedure Laterality Date   BREAST BIOPSY  90s   stereo bx    CARDIAC ELECTROPHYSIOLOGY STUDY AND ABLATION     COLONOSCOPY WITH PROPOFOL  N/A 03/05/2018   Procedure: COLONOSCOPY WITH PROPOFOL ;  Surgeon: Viktoria Lamar DASEN, MD;  Location: [facility] ENDOSCOPY;  Service: Endoscopy;  Laterality: N/A;   MECHANICAL AORTIC VALVE REPLACEMENT  2007   pace maker  2015     Current Outpatient Medications  Medication Sig Dispense Refill   acetic acid  2 % otic solution Place 4 drops into both ears 3 (three) times daily. 15 mL 0   Cholecalciferol (VITAMIN D3) 2000 units capsule Take by mouth.     fluticasone  (FLONASE ) 50 MCG/ACT nasal spray Place 1 spray into both nostrils daily. 16 g 2   furosemide (LASIX) 20 MG tablet TAKE 1 TABLET BY MOUTH EVERY DAY 90 tablet 1   lisinopril (ZESTRIL) 5 MG tablet TAKE 1 TABLET BY MOUTH DAILY 90 tablet 1   metoprolol  succinate (TOPROL -XL) 100 MG 24 hr tablet TAKE 1 TABLET(100 MG) BY MOUTH DAILY 90 tablet 3   pantoprazole  (PROTONIX ) 40 MG tablet TAKE 1 TABLET(40 MG) BY MOUTH DAILY 30 tablet 1   simvastatin  (ZOCOR ) 20 MG tablet Take 1 tablet (20 mg total) by mouth daily. 90 tablet 3   warfarin (COUMADIN ) 6 MG tablet TAKE 1 TABLET BY  MOUTH DAILY 30 tablet 2   No current facility-administered medications for this visit.    Allergies:   Hydralazine    Social History:   reports that she has never smoked. She has never used smokeless tobacco. She reports that she does not drink alcohol and does not use drugs.   Family History:  family history is not on file.    ROS:     Review of Systems  Constitutional: Negative.   HENT: Negative.    Eyes: Negative.   Respiratory: Negative.    Gastrointestinal: Negative.   Genitourinary: Negative.   Musculoskeletal: Negative.   Skin: Negative.   Neurological: Negative.   Endo/Heme/Allergies: Negative.   Psychiatric/Behavioral: Negative.    All other systems reviewed and are negative.     All other systems are reviewed and negative.    PHYSICAL EXAM: VS:  BP 104/60   Pulse 70   Ht 5' 5 (1.651 m)   Wt 147 lb (66.7 kg)   SpO2 97%   BMI 24.46 kg/m  , BMI Body mass index is 24.46 kg/m. Last weight:  Wt Readings from Last 3 Encounters:  07/04/24 147 lb (66.7 kg)  05/28/24 150  lb 6.4 oz (68.2 kg)  05/24/24 150 lb 3.2 oz (68.1 kg)     Physical Exam Constitutional:      Appearance: Normal appearance.  Cardiovascular:     Rate and Rhythm: Normal rate and regular rhythm.     Heart sounds: Normal heart sounds.  Pulmonary:     Effort: Pulmonary effort is normal.     Breath sounds: Normal breath sounds.  Musculoskeletal:     Right lower leg: No edema.     Left lower leg: No edema.  Neurological:     Mental Status: She is alert.       EKG:   Recent Labs: 09/29/2023: Hemoglobin 13.2; Platelets 175 05/17/2024: ALT 12; BUN 13; Creatinine, Ser 1.11; Potassium 4.4; Sodium 138; TSH 1.800    Lipid Panel    Component Value Date/Time   CHOL 198 05/17/2024 0915   TRIG 107 05/17/2024 0915   HDL 66 05/17/2024 0915   CHOLHDL 3.0 05/17/2024 0915   LDLCALC 113 (H) 05/17/2024 0915      Other studies Reviewed: Additional studies/ records that were reviewed  today include:  Review of the above records demonstrates:       No data to display            ASSESSMENT AND PLAN:    ICD-10-CM   1. Nonrheumatic aortic valve stenosis  I35.0 Protime-INR   On 6 mg daily coumadin  right now.    2. Mixed hyperlipidemia  E78.2 Protime-INR    3. S/P ablation of atrial fibrillation  Z98.890 Protime-INR   Z86.79     4. Cardiac pacemaker in situ  Z95.0 Protime-INR    5. Chest pain, non-cardiac  R07.89 Protime-INR   resolved as on protonix  probably due to GERD.       Problem List Items Addressed This Visit       Cardiovascular and Mediastinum   Aortic stenosis - Primary   Relevant Orders   Protime-INR     Other   Mixed hyperlipidemia   Relevant Orders   Protime-INR   Cardiac pacemaker in situ   Relevant Orders   Protime-INR   S/P ablation of atrial fibrillation   Relevant Orders   Protime-INR   Other Visit Diagnoses       Chest pain, non-cardiac       resolved as on protonix  probably due to GERD.   Relevant Orders   Protime-INR          Disposition:   Return in about 4 months (around 11/03/2024).    Total time spent: 30 minutes  Signed,  Denyse Bathe, MD  07/04/2024 1:46 PM    [facility]

## 2024-07-05 ENCOUNTER — Ambulatory Visit: Admitting: Cardiovascular Disease

## 2024-07-05 LAB — SPECIMEN STATUS REPORT

## 2024-07-05 LAB — APTT: aPTT: 29 s (ref 24–33)

## 2024-07-09 ENCOUNTER — Other Ambulatory Visit: Payer: Self-pay | Admitting: Cardiovascular Disease

## 2024-07-09 ENCOUNTER — Other Ambulatory Visit

## 2024-07-09 DIAGNOSIS — I480 Paroxysmal atrial fibrillation: Secondary | ICD-10-CM

## 2024-07-10 LAB — PROTIME-INR
INR: 2.6 — ABNORMAL HIGH (ref 0.9–1.2)
Prothrombin Time: 26.7 s — ABNORMAL HIGH (ref 9.1–12.0)

## 2024-08-08 ENCOUNTER — Other Ambulatory Visit: Payer: Self-pay | Admitting: Cardiovascular Disease

## 2024-08-08 ENCOUNTER — Other Ambulatory Visit

## 2024-08-09 LAB — PROTIME-INR
INR: 2.4 — ABNORMAL HIGH (ref 0.9–1.2)
Prothrombin Time: 24.6 s — ABNORMAL HIGH (ref 9.1–12.0)

## 2024-08-12 ENCOUNTER — Ambulatory Visit: Payer: Self-pay

## 2024-10-04 ENCOUNTER — Other Ambulatory Visit: Payer: Self-pay | Admitting: Cardiovascular Disease

## 2024-10-04 DIAGNOSIS — I480 Paroxysmal atrial fibrillation: Secondary | ICD-10-CM

## 2024-10-04 DIAGNOSIS — I50814 Right heart failure due to left heart failure: Secondary | ICD-10-CM

## 2024-10-04 DIAGNOSIS — I350 Nonrheumatic aortic (valve) stenosis: Secondary | ICD-10-CM

## 2024-10-04 DIAGNOSIS — E782 Mixed hyperlipidemia: Secondary | ICD-10-CM

## 2024-10-04 DIAGNOSIS — Z8679 Personal history of other diseases of the circulatory system: Secondary | ICD-10-CM

## 2024-10-04 DIAGNOSIS — Z950 Presence of cardiac pacemaker: Secondary | ICD-10-CM

## 2024-10-13 ENCOUNTER — Emergency Department

## 2024-10-13 ENCOUNTER — Other Ambulatory Visit: Payer: Self-pay

## 2024-10-13 ENCOUNTER — Emergency Department
Admission: EM | Admit: 2024-10-13 | Discharge: 2024-10-13 | Disposition: A | Discharge status: Home / Self Care | Attending: Emergency Medicine | Admitting: Emergency Medicine

## 2024-10-13 DIAGNOSIS — W01198A Fall on same level from slipping, tripping and stumbling with subsequent striking against other object, initial encounter: Secondary | ICD-10-CM | POA: Insufficient documentation

## 2024-10-13 DIAGNOSIS — I482 Chronic atrial fibrillation, unspecified: Secondary | ICD-10-CM | POA: Insufficient documentation

## 2024-10-13 DIAGNOSIS — Z7901 Long term (current) use of anticoagulants: Secondary | ICD-10-CM | POA: Insufficient documentation

## 2024-10-13 DIAGNOSIS — S20223A Contusion of bilateral back wall of thorax, initial encounter: Secondary | ICD-10-CM | POA: Insufficient documentation

## 2024-10-13 DIAGNOSIS — W19XXXA Unspecified fall, initial encounter: Secondary | ICD-10-CM

## 2024-10-13 DIAGNOSIS — S0990XA Unspecified injury of head, initial encounter: Secondary | ICD-10-CM | POA: Insufficient documentation

## 2024-10-13 DIAGNOSIS — Z950 Presence of cardiac pacemaker: Secondary | ICD-10-CM | POA: Insufficient documentation

## 2024-10-13 DIAGNOSIS — I509 Heart failure, unspecified: Secondary | ICD-10-CM | POA: Insufficient documentation

## 2024-10-13 DIAGNOSIS — N183 Chronic kidney disease, stage 3 unspecified: Secondary | ICD-10-CM | POA: Insufficient documentation

## 2024-10-13 HISTORY — DX: Unspecified atrial fibrillation: I48.91

## 2024-10-13 NOTE — ED Triage Notes (Signed)
 Pt to ED after mechanical fall this AM down 4 steps. Fell onto R upper back and back of head and neck. Denies CP, dizziness, SOB, LOC. States she has a pacemaker and wants to make sure none of the wires got knocked loose. Complains of R lower back pain, no other pain. Ambulatory with steady gait.

## 2024-10-13 NOTE — ED Notes (Signed)
 Fall precautions were noted but not currently implemented as patient walks freely without issue under her own power

## 2024-10-13 NOTE — Discharge Instructions (Addendum)
 Your CT scans and x-ray was normal as we discussed.  Please follow-up with your outpatient provider.  Please return if you develop any numbness, tingling, weakness, lightheadedness, feeling like you are going to pass out, worsening headache, or vomiting.  It was a pleasure caring for you today.

## 2024-10-13 NOTE — ED Provider Notes (Signed)
 [facility] Provider Note    Event Date/Time   First MD Initiated Contact with Patient 10/13/24 [phone]     (approximate)   History   Fall   HPI  Kristin Ferrell is a 78 y.o. female with a past medical history of CKD, hyperlipidemia, atrial fibrillation, valve replacement, pacemaker who presents today for evaluation after a fall that occurred this morning.  She reports that she hit the back of her head and upper back.  She reports that she is here to make sure that her pacemaker wires did not get knocked loose.  She denies any palpitations, dizziness, chest pain, or any concerns right now.  Denies any pain anywhere.  She is on Coumadin .  Patient Active Problem List   Diagnosis Date Noted   CHF (congestive heart failure) (HCC) 03/02/2023   Prediabetes 01/31/2023   Constipation 01/31/2023   Allergic rhinitis 01/31/2023   Mixed hyperlipidemia 01/31/2023   CKD (chronic kidney disease) stage 3, GFR 30-59 ml/min (HCC) 07/11/2022   Paroxysmal atrial fibrillation (HCC) 10/07/2021   Cardiac pacemaker in situ 01/30/2014   Hyperthyroidism 02/07/2013   S/P ablation of atrial fibrillation 05/09/2012   Hearing impairment 05/08/2012   Aortic stenosis 04/29/2012   Anticoagulation goal of INR 2.5 to 3.5 07/21/2006   H/O mechanical aortic valve replacement 07/21/2006          Physical Exam   Triage Vital Signs: ED Triage Vitals  Encounter Vitals Group     BP 10/13/24 0849 118/64     Girls Systolic BP Percentile --      Girls Diastolic BP Percentile --      Boys Systolic BP Percentile --      Boys Diastolic BP Percentile --      Pulse Rate 10/13/24 0849 70     Resp 10/13/24 0849 16     Temp 10/13/24 0849 97.6 F (36.4 C)     Temp Source 10/13/24 0849 Oral     SpO2 10/13/24 0849 100 %     Weight 10/13/24 0850 147 lb (66.7 kg)     Height 10/13/24 0850 5' 5 (1.651 m)     Head Circumference --      Peak Flow --      Pain Score 10/13/24 0848 5     Pain Loc  --      Pain Education --      Exclude from Growth Chart --     Most recent vital signs: Vitals:   10/13/24 0849 10/13/24 0859  BP: 118/64   Pulse: 70   Resp: 16   Temp: 97.6 F (36.4 C) 97.9 F (36.6 C)  SpO2: 100%     Physical Exam Vitals and nursing note reviewed.  Constitutional:      General: Awake and alert. No acute distress.    Appearance: Normal appearance. The patient is normal weight.  HENT:     Head: Normocephalic and atraumatic.     Mouth: Mucous membranes are moist.  Eyes:     General: PERRL. Normal EOMs        Right eye: No discharge.        Left eye: No discharge.     Conjunctiva/sclera: Conjunctivae normal.  Cardiovascular:     Rate and Rhythm: Normal rate and regular rhythm.     Pulses: Normal pulses.  Pulmonary:     Effort: Pulmonary effort is normal. No respiratory distress.     Breath sounds: Normal breath sounds.  Abdominal:  Abdomen is soft. There is no abdominal tenderness. No rebound or guarding. No distention. Musculoskeletal:        General: No swelling. Normal range of motion.     Cervical back: Normal range of motion and neck supple. No midline cervical spine tenderness.  Full range of motion of neck.  Negative Spurling test.  Negative Lhermitte sign.  Normal strength and sensation in bilateral upper extremities. Normal grip strength bilaterally.  Normal intrinsic muscle function of the hand bilaterally.  Normal radial pulses bilaterally. Mild thoracic tenderness without ecchymosis or swelling or other skin injuries noted.  No midline lumbar tenderness. Strength and sensation 5/5 to bilateral lower extremities. Normal great toe extension against resistance. Normal sensation throughout feet. Normal patellar reflexes. Negative SLR and opposite SLR bilaterally.  Skin:    General: Skin is warm and dry.     Capillary Refill: Capillary refill takes less than 2 seconds.     Findings: No rash.  Neurological:     Mental Status: The patient is  awake and alert.   Neurological: GCS 15 alert and oriented x3 Normal speech, no expressive or receptive aphasia or dysarthria Cranial nerves II through XII intact Normal visual fields 5 out of 5 strength in all 4 extremities with intact sensation throughout No extremity drift Normal finger-to-nose testing, no limb or truncal ataxia    ED Results / Procedures / Treatments   Labs (all labs ordered are listed, but only abnormal results are displayed) Labs Reviewed - No data to display   EKG     RADIOLOGY I independently reviewed and interpreted imaging and agree with radiologists findings.     PROCEDURES:  Critical Care performed:   Procedures   MEDICATIONS ORDERED IN ED: Medications - No data to display   IMPRESSION / MDM / ASSESSMENT AND PLAN / ED COURSE  I reviewed the triage vital signs and the nursing notes.   Differential diagnosis includes, but is not limited to, contusion, compression fracture, intracranial hemorrhage, concussion, cervical spine injury.  I reviewed the patient's chart.  Patient is followed by cardiology for her pacemaker and nonrheumatic aortic valve stenosis with valve replacement.  Patient is awake and alert, hemodynamically stable and afebrile.  She has no dizziness or lightheadedness or evidence at this time of pacemaker activation.  EKG reveals a paced rhythm.  Chest x-ray shows intact wires.  CT scans obtained per [place] criteria given that she is on Coumadin , as well as a CT thoracic spine given that she had tenderness in this area.  All CT imaging is normal.  Patient is reassured by these results.  She has normal strength and sensation of bilateral upper and lower extremities, no radicular type symptoms, do not suspect epidural hematoma or cord compression at this time.  We discussed return precautions and outpatient follow-up.  Patient understands and agrees with plan.  She was discharged in stable condition.  Case discussed with Dr.  Ernest who agrees with assessment and plan.  Patient's presentation is most consistent with acute complicated illness / injury requiring diagnostic workup.     FINAL CLINICAL IMPRESSION(S) / ED DIAGNOSES   Final diagnoses:  Fall, initial encounter  Minor head injury, initial encounter  Contusion of both sides of back wall of thorax, initial encounter     Rx / DC Orders   ED Discharge Orders     None        Note:  This document was prepared using Dragon voice recognition software and may include unintentional  dictation errors.   Darice Vicario E, PA-C 10/13/24 1230    Ernest Ronal BRAVO, MD 10/14/24 [phone]

## 2024-10-16 ENCOUNTER — Other Ambulatory Visit

## 2024-10-16 ENCOUNTER — Other Ambulatory Visit: Payer: Self-pay | Admitting: Cardiovascular Disease

## 2024-10-16 DIAGNOSIS — I480 Paroxysmal atrial fibrillation: Secondary | ICD-10-CM

## 2024-10-17 LAB — PROTIME-INR
INR: 3.2 — ABNORMAL HIGH (ref 0.9–1.2)
Prothrombin Time: 31.9 s — ABNORMAL HIGH (ref 9.1–12.0)

## 2024-10-21 ENCOUNTER — Ambulatory Visit: Payer: Self-pay

## 2024-10-21 DIAGNOSIS — W19XXXA Unspecified fall, initial encounter: Secondary | ICD-10-CM

## 2024-10-21 DIAGNOSIS — M545 Low back pain, unspecified: Secondary | ICD-10-CM

## 2024-10-21 DIAGNOSIS — M25551 Pain in right hip: Secondary | ICD-10-CM

## 2024-10-22 ENCOUNTER — Ambulatory Visit
Admission: RE | Admit: 2024-10-22 | Discharge: 2024-10-22 | Disposition: A | Source: Ambulatory Visit | Attending: Family | Admitting: Family

## 2024-10-22 ENCOUNTER — Ambulatory Visit: Admission: RE | Admit: 2024-10-22 | Discharge: 2024-10-22 | Disposition: A | Attending: Family | Admitting: Family

## 2024-10-22 DIAGNOSIS — S3993XA Unspecified injury of pelvis, initial encounter: Secondary | ICD-10-CM | POA: Insufficient documentation

## 2024-10-22 DIAGNOSIS — M545 Low back pain, unspecified: Secondary | ICD-10-CM | POA: Diagnosis not present

## 2024-10-22 DIAGNOSIS — W109XXA Fall (on) (from) unspecified stairs and steps, initial encounter: Secondary | ICD-10-CM | POA: Insufficient documentation

## 2024-10-22 DIAGNOSIS — M25552 Pain in left hip: Secondary | ICD-10-CM | POA: Insufficient documentation

## 2024-10-22 DIAGNOSIS — R0789 Other chest pain: Secondary | ICD-10-CM | POA: Diagnosis not present

## 2024-10-22 DIAGNOSIS — W19XXXA Unspecified fall, initial encounter: Secondary | ICD-10-CM

## 2024-10-22 DIAGNOSIS — M25551 Pain in right hip: Secondary | ICD-10-CM

## 2024-10-22 NOTE — Addendum Note (Signed)
 Addended by: ORLEAN PALMA on: 10/22/2024 03:14 PM   Modules accepted: Orders

## 2024-10-27 ENCOUNTER — Other Ambulatory Visit: Payer: Self-pay | Admitting: Cardiovascular Disease

## 2024-11-04 ENCOUNTER — Ambulatory Visit: Payer: Self-pay

## 2024-11-08 ENCOUNTER — Other Ambulatory Visit

## 2024-11-08 ENCOUNTER — Other Ambulatory Visit: Payer: Self-pay | Admitting: Cardiovascular Disease

## 2024-11-09 LAB — PROTIME-INR
INR: 3.3 — ABNORMAL HIGH (ref 0.9–1.2)
Prothrombin Time: 33.4 s — ABNORMAL HIGH (ref 9.1–12.0)

## 2024-11-12 ENCOUNTER — Ambulatory Visit: Admitting: Cardiovascular Disease

## 2024-11-19 ENCOUNTER — Ambulatory Visit: Admitting: Cardiovascular Disease

## 2024-11-19 ENCOUNTER — Encounter: Payer: Self-pay | Admitting: Cardiovascular Disease

## 2024-11-19 ENCOUNTER — Other Ambulatory Visit: Payer: Self-pay

## 2024-11-19 VITALS — BP 112/68 | HR 78 | Ht 65.0 in | Wt 150.0 lb

## 2024-11-19 DIAGNOSIS — I10 Essential (primary) hypertension: Secondary | ICD-10-CM

## 2024-11-19 DIAGNOSIS — Z9889 Other specified postprocedural states: Secondary | ICD-10-CM

## 2024-11-19 DIAGNOSIS — E0590 Thyrotoxicosis, unspecified without thyrotoxic crisis or storm: Secondary | ICD-10-CM

## 2024-11-19 DIAGNOSIS — Z0130 Encounter for examination of blood pressure without abnormal findings: Secondary | ICD-10-CM

## 2024-11-19 DIAGNOSIS — I350 Nonrheumatic aortic (valve) stenosis: Secondary | ICD-10-CM

## 2024-11-19 DIAGNOSIS — I480 Paroxysmal atrial fibrillation: Secondary | ICD-10-CM

## 2024-11-19 DIAGNOSIS — E782 Mixed hyperlipidemia: Secondary | ICD-10-CM

## 2024-11-19 DIAGNOSIS — E538 Deficiency of other specified B group vitamins: Secondary | ICD-10-CM

## 2024-11-19 DIAGNOSIS — R7303 Prediabetes: Secondary | ICD-10-CM

## 2024-11-19 DIAGNOSIS — I50814 Right heart failure due to left heart failure: Secondary | ICD-10-CM | POA: Diagnosis not present

## 2024-11-19 DIAGNOSIS — E559 Vitamin D deficiency, unspecified: Secondary | ICD-10-CM

## 2024-11-19 DIAGNOSIS — Z950 Presence of cardiac pacemaker: Secondary | ICD-10-CM | POA: Diagnosis not present

## 2024-11-19 DIAGNOSIS — Z8679 Personal history of other diseases of the circulatory system: Secondary | ICD-10-CM | POA: Diagnosis not present

## 2024-11-19 MED ORDER — WARFARIN SODIUM 5 MG PO TABS: 5.0000 mg | ORAL_TABLET | Freq: Every day | ORAL | 2 refills | Status: AC

## 2024-11-19 NOTE — Progress Notes (Signed)
 "     Cardiology Office Note   Date:  11/19/2024   ID:  Gaye, Scorza [birth year], MRN [id number]  PCP:  Orlean Alan CHRISTELLA, FNP  Cardiologist:  Denyse Bathe, MD      History of Present Illness: Kristin Ferrell is a 79 y.o. female who presents for  Chief Complaint  Patient presents with   Follow-up    4 month follow up    Doing well.      Past Medical History:  Diagnosis Date   Anxiety    Aortic stenosis    Arthritis    Atrial fibrillation (HCC)    CHF (congestive heart failure) (HCC)    Dysrhythmia    Hyperlipidemia    Hypertension    Hypothyroidism    Mitral regurgitation    Tricuspid regurgitation    Vitamin D  deficiency      Past Surgical History:  Procedure Laterality Date   BREAST BIOPSY  90s   stereo bx    CARDIAC ELECTROPHYSIOLOGY STUDY AND ABLATION     COLONOSCOPY WITH PROPOFOL  N/A 03/05/2018   Procedure: COLONOSCOPY WITH PROPOFOL ;  Surgeon: Viktoria Lamar DASEN, MD;  Location: [facility] ENDOSCOPY;  Service: Endoscopy;  Laterality: N/A;   MECHANICAL AORTIC VALVE REPLACEMENT  2007   pace maker  2015     Current Outpatient Medications  Medication Sig Dispense Refill   warfarin (COUMADIN ) 5 MG tablet Take 1 tablet (5 mg total) by mouth daily. 30 tablet 2   acetic acid  2 % otic solution Place 4 drops into both ears 3 (three) times daily. 15 mL 0   Cholecalciferol (VITAMIN D3) 2000 units capsule Take by mouth.     fluticasone  (FLONASE ) 50 MCG/ACT nasal spray Place 1 spray into both nostrils daily. 16 g 2   furosemide (LASIX) 20 MG tablet TAKE 1 TABLET BY MOUTH EVERY DAY 90 tablet 1   lisinopril (ZESTRIL) 5 MG tablet TAKE 1 TABLET BY MOUTH DAILY 90 tablet 1   metoprolol  succinate (TOPROL -XL) 100 MG 24 hr tablet TAKE 1 TABLET(100 MG) BY MOUTH DAILY 90 tablet 3   pantoprazole  (PROTONIX ) 40 MG tablet TAKE 1 TABLET(40 MG) BY MOUTH DAILY 30 tablet 1   simvastatin  (ZOCOR ) 20 MG tablet Take 1 tablet (20 mg total) by mouth daily. 90 tablet 3   No current  facility-administered medications for this visit.    Allergies:   Hydralazine    Social History:   reports that she has never smoked. She has never used smokeless tobacco. She reports that she does not drink alcohol and does not use drugs.   Family History:  family history is not on file.    ROS:     Review of Systems  Constitutional: Negative.   HENT: Negative.    Eyes: Negative.   Respiratory: Negative.    Gastrointestinal: Negative.   Genitourinary: Negative.   Musculoskeletal: Negative.   Skin: Negative.   Neurological: Negative.   Endo/Heme/Allergies: Negative.   Psychiatric/Behavioral: Negative.    All other systems reviewed and are negative.     All other systems are reviewed and negative.    PHYSICAL EXAM: VS:  BP 112/68   Pulse 78   Ht 5' 5 (1.651 m)   Wt 150 lb (68 kg)   SpO2 93%   BMI 24.96 kg/m  , BMI Body mass index is 24.96 kg/m. Last weight:  Wt Readings from Last 3 Encounters:  11/19/24 150 lb (68 kg)  10/13/24 147 lb (66.7 kg)  07/04/24  147 lb (66.7 kg)     Physical Exam Constitutional:      Appearance: Normal appearance.  Cardiovascular:     Rate and Rhythm: Normal rate and regular rhythm.     Heart sounds: Normal heart sounds.  Pulmonary:     Effort: Pulmonary effort is normal.     Breath sounds: Normal breath sounds.  Musculoskeletal:     Right lower leg: No edema.     Left lower leg: No edema.  Neurological:     Mental Status: She is alert.       EKG:   Recent Labs: 05/17/2024: ALT 12; BUN 13; Creatinine, Ser 1.11; Potassium 4.4; Sodium 138; TSH 1.800 07/04/2024: Hemoglobin 13.4; Platelets 197    Lipid Panel    Component Value Date/Time   CHOL 198 05/17/2024 0915   TRIG 107 05/17/2024 0915   HDL 66 05/17/2024 0915   CHOLHDL 3.0 05/17/2024 0915   LDLCALC 113 (H) 05/17/2024 0915      Other studies Reviewed: Additional studies/ records that were reviewed today include:  Review of the above records demonstrates:        No data to display            ASSESSMENT AND PLAN:    ICD-10-CM   1. Cardiac pacemaker in situ  Z95.0 warfarin (COUMADIN ) 5 MG tablet    PCV ECHOCARDIOGRAM COMPLETE   Was checked, and is fine.    2. Nonrheumatic aortic valve stenosis  I35.0 warfarin (COUMADIN ) 5 MG tablet    PCV ECHOCARDIOGRAM COMPLETE    3. 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 Paroxysmal atrial fibrillation (HCC)  I48.0 warfarin (COUMADIN ) 5 MG tablet    PCV ECHOCARDIOGRAM COMPLETE   Get ECHO    5. S/P ablation of atrial fibrillation  Z98.890 warfarin (COUMADIN ) 5 MG tablet   Z86.79 PCV ECHOCARDIOGRAM COMPLETE   INR 3.3 on Coumadin  6 mg , may change 5 mg daily, if INR still over 3    6. Mixed hyperlipidemia  E78.2 warfarin (COUMADIN ) 5 MG tablet    PCV ECHOCARDIOGRAM COMPLETE       Problem List Items Addressed This Visit       Cardiovascular and Mediastinum   Paroxysmal atrial fibrillation (HCC) (Chronic)   Relevant Medications   warfarin (COUMADIN ) 5 MG tablet   Other Relevant Orders   PCV ECHOCARDIOGRAM COMPLETE   Aortic stenosis   Relevant Medications   warfarin (COUMADIN ) 5 MG tablet   Other Relevant Orders   PCV ECHOCARDIOGRAM COMPLETE   CHF (congestive heart failure) (HCC)   Relevant Medications   warfarin (COUMADIN ) 5 MG tablet   Other Relevant Orders   PCV ECHOCARDIOGRAM COMPLETE     Other   Mixed hyperlipidemia   Relevant Medications   warfarin (COUMADIN ) 5 MG tablet   Other Relevant Orders   PCV ECHOCARDIOGRAM COMPLETE   Cardiac pacemaker in situ - Primary   Relevant Medications   warfarin (COUMADIN ) 5 MG tablet   Other Relevant Orders   PCV ECHOCARDIOGRAM COMPLETE   S/P ablation of atrial fibrillation   Relevant Medications   warfarin (COUMADIN ) 5 MG tablet   Other Relevant Orders   PCV ECHOCARDIOGRAM COMPLETE        Disposition:   Return in about 3 months (around 02/17/2025) for echo and f/u.    Total time spent: 35 minutes  Signed,  [employer]  Fernand, MD  11/19/2024 9:29 AM    [facility] "

## 2024-11-20 ENCOUNTER — Other Ambulatory Visit: Payer: Self-pay | Admitting: Cardiovascular Disease

## 2024-11-20 LAB — CBC WITH DIFFERENTIAL/PLATELET
Basophils Absolute: 0.1 x10E3/uL (ref 0.0–0.2)
Basos: 1 %
EOS (ABSOLUTE): 0.1 x10E3/uL (ref 0.0–0.4)
Eos: 2 %
Hematocrit: 39.2 % (ref 34.0–46.6)
Hemoglobin: 12.9 g/dL (ref 11.1–15.9)
Immature Grans (Abs): 0 x10E3/uL (ref 0.0–0.1)
Immature Granulocytes: 0 %
Lymphocytes Absolute: 1.3 x10E3/uL (ref 0.7–3.1)
Lymphs: 24 %
MCH: 30.6 pg (ref 26.6–33.0)
MCHC: 32.9 g/dL (ref 31.5–35.7)
MCV: 93 fL (ref 79–97)
Monocytes Absolute: 0.5 x10E3/uL (ref 0.1–0.9)
Monocytes: 9 %
Neutrophils Absolute: 3.5 x10E3/uL (ref 1.4–7.0)
Neutrophils: 64 %
Platelets: 173 x10E3/uL (ref 150–450)
RBC: 4.22 x10E6/uL (ref 3.77–5.28)
RDW: 12.9 % (ref 11.7–15.4)
WBC: 5.5 x10E3/uL (ref 3.4–10.8)

## 2024-11-20 LAB — CMP14+EGFR
ALT: 11 IU/L (ref 0–32)
AST: 22 IU/L (ref 0–40)
Albumin: 4.3 g/dL (ref 3.8–4.8)
Alkaline Phosphatase: 79 IU/L (ref 49–135)
BUN/Creatinine Ratio: 11 — ABNORMAL LOW (ref 12–28)
BUN: 12 mg/dL (ref 8–27)
Bilirubin Total: 1.2 mg/dL (ref 0.0–1.2)
CO2: 23 mmol/L (ref 20–29)
Calcium: 9.3 mg/dL (ref 8.7–10.3)
Chloride: 99 mmol/L (ref 96–106)
Creatinine, Ser: 1.13 mg/dL — ABNORMAL HIGH (ref 0.57–1.00)
Globulin, Total: 2.3 g/dL (ref 1.5–4.5)
Glucose: 88 mg/dL (ref 70–99)
Potassium: 4.1 mmol/L (ref 3.5–5.2)
Sodium: 137 mmol/L (ref 134–144)
Total Protein: 6.6 g/dL (ref 6.0–8.5)
eGFR: 50 mL/min/1.73 — ABNORMAL LOW

## 2024-11-20 LAB — VITAMIN B12: Vitamin B-12: 385 pg/mL (ref 232–1245)

## 2024-11-20 LAB — LIPID PANEL
Chol/HDL Ratio: 2.8 ratio (ref 0.0–4.4)
Cholesterol, Total: 191 mg/dL (ref 100–199)
HDL: 68 mg/dL
LDL Chol Calc (NIH): 106 mg/dL — ABNORMAL HIGH (ref 0–99)
Triglycerides: 95 mg/dL (ref 0–149)
VLDL Cholesterol Cal: 17 mg/dL (ref 5–40)

## 2024-11-20 LAB — TSH: TSH: 1.82 u[IU]/mL (ref 0.450–4.500)

## 2024-11-20 LAB — VITAMIN D 25 HYDROXY (VIT D DEFICIENCY, FRACTURES): Vit D, 25-Hydroxy: 56.4 ng/mL (ref 30.0–100.0)

## 2024-11-20 LAB — HEMOGLOBIN A1C
Est. average glucose Bld gHb Est-mCnc: 114 mg/dL
Hgb A1c MFr Bld: 5.6 % (ref 4.8–5.6)

## 2024-11-21 ENCOUNTER — Ambulatory Visit: Payer: Self-pay

## 2024-11-22 ENCOUNTER — Other Ambulatory Visit: Payer: Self-pay | Admitting: Cardiovascular Disease

## 2024-11-22 DIAGNOSIS — Z8679 Personal history of other diseases of the circulatory system: Secondary | ICD-10-CM

## 2024-11-22 DIAGNOSIS — I350 Nonrheumatic aortic (valve) stenosis: Secondary | ICD-10-CM

## 2024-11-22 DIAGNOSIS — Z950 Presence of cardiac pacemaker: Secondary | ICD-10-CM

## 2024-11-22 DIAGNOSIS — E782 Mixed hyperlipidemia: Secondary | ICD-10-CM

## 2024-11-22 DIAGNOSIS — I50814 Right heart failure due to left heart failure: Secondary | ICD-10-CM

## 2024-11-22 DIAGNOSIS — I480 Paroxysmal atrial fibrillation: Secondary | ICD-10-CM

## 2024-11-28 ENCOUNTER — Other Ambulatory Visit

## 2024-11-28 ENCOUNTER — Other Ambulatory Visit: Payer: Self-pay | Admitting: Cardiovascular Disease

## 2024-11-28 DIAGNOSIS — I350 Nonrheumatic aortic (valve) stenosis: Secondary | ICD-10-CM

## 2024-11-29 ENCOUNTER — Ambulatory Visit: Payer: Self-pay | Admitting: Cardiology

## 2024-11-29 LAB — PROTIME-INR
INR: 2.5 — ABNORMAL HIGH (ref 0.9–1.2)
Prothrombin Time: 25.6 s — ABNORMAL HIGH (ref 9.1–12.0)

## 2024-12-02 ENCOUNTER — Ambulatory Visit: Admitting: Family

## 2024-12-06 ENCOUNTER — Other Ambulatory Visit: Payer: Self-pay | Admitting: Cardiovascular Disease

## 2024-12-06 DIAGNOSIS — I480 Paroxysmal atrial fibrillation: Secondary | ICD-10-CM

## 2024-12-06 DIAGNOSIS — I50814 Right heart failure due to left heart failure: Secondary | ICD-10-CM

## 2024-12-06 DIAGNOSIS — Z8679 Personal history of other diseases of the circulatory system: Secondary | ICD-10-CM

## 2024-12-06 DIAGNOSIS — E782 Mixed hyperlipidemia: Secondary | ICD-10-CM

## 2024-12-06 DIAGNOSIS — Z950 Presence of cardiac pacemaker: Secondary | ICD-10-CM

## 2024-12-06 DIAGNOSIS — I350 Nonrheumatic aortic (valve) stenosis: Secondary | ICD-10-CM

## 2024-12-24 ENCOUNTER — Ambulatory Visit: Admitting: Family

## 2025-02-14 ENCOUNTER — Other Ambulatory Visit

## 2025-02-20 ENCOUNTER — Ambulatory Visit: Admitting: Cardiovascular Disease
